# Patient Record
Sex: Male | Born: 1950 | Race: White | Hispanic: No | Marital: Married | Smoking: Never smoker
Health system: Southern US, Community
[De-identification: ages and names within clinical notes are randomized; demographics above are authoritative.]

## PROBLEM LIST (undated history)

## (undated) DIAGNOSIS — B1920 Unspecified viral hepatitis C without hepatic coma: Secondary | ICD-10-CM

## (undated) DIAGNOSIS — M1990 Unspecified osteoarthritis, unspecified site: Secondary | ICD-10-CM

## (undated) DIAGNOSIS — D1720 Benign lipomatous neoplasm of skin and subcutaneous tissue of unspecified limb: Secondary | ICD-10-CM

## (undated) DIAGNOSIS — E785 Hyperlipidemia, unspecified: Secondary | ICD-10-CM

## (undated) HISTORY — PX: CYSTECTOMY: SUR359

## (undated) HISTORY — PX: EYE SURGERY: SHX253

## (undated) HISTORY — DX: Unspecified osteoarthritis, unspecified site: M19.90

## (undated) HISTORY — PX: JOINT REPLACEMENT: SHX530

## (undated) HISTORY — DX: Hyperlipidemia, unspecified: E78.5

## (undated) HISTORY — PX: FOOT ARTHRODESIS, SUBTALAR: SUR53

## (undated) HISTORY — DX: Unspecified viral hepatitis C without hepatic coma: B19.20

## (undated) HISTORY — PX: TONSILLECTOMY: SUR1361

## (undated) HISTORY — DX: Benign lipomatous neoplasm of skin and subcutaneous tissue of unspecified limb: D17.20

---

## 1996-08-16 HISTORY — PX: APPENDECTOMY: SHX54

## 1996-08-16 HISTORY — PX: OTHER SURGICAL HISTORY: SHX169

## 1996-08-16 HISTORY — PX: NASAL SEPTUM SURGERY: SHX37

## 1998-07-17 ENCOUNTER — Emergency Department (HOSPITAL_COMMUNITY): Admission: EM | Admit: 1998-07-17 | Discharge: 1998-07-17 | Payer: Self-pay | Admitting: Emergency Medicine

## 1999-01-23 ENCOUNTER — Ambulatory Visit (HOSPITAL_COMMUNITY): Admission: RE | Admit: 1999-01-23 | Discharge: 1999-01-23 | Payer: Self-pay | Admitting: Family Medicine

## 1999-01-23 ENCOUNTER — Encounter: Payer: Self-pay | Admitting: Family Medicine

## 1999-02-06 ENCOUNTER — Encounter: Payer: Self-pay | Admitting: Family Medicine

## 1999-02-06 ENCOUNTER — Ambulatory Visit (HOSPITAL_COMMUNITY): Admission: RE | Admit: 1999-02-06 | Discharge: 1999-02-06 | Payer: Self-pay | Admitting: Family Medicine

## 2003-12-17 ENCOUNTER — Ambulatory Visit (HOSPITAL_COMMUNITY): Admission: RE | Admit: 2003-12-17 | Discharge: 2003-12-17 | Payer: Self-pay | Admitting: Family Medicine

## 2004-03-09 ENCOUNTER — Ambulatory Visit (HOSPITAL_COMMUNITY): Admission: RE | Admit: 2004-03-09 | Discharge: 2004-03-09 | Payer: Self-pay | Admitting: Gastroenterology

## 2005-01-22 ENCOUNTER — Ambulatory Visit (HOSPITAL_COMMUNITY): Admission: RE | Admit: 2005-01-22 | Discharge: 2005-01-22 | Payer: Self-pay | Admitting: Orthopedic Surgery

## 2010-08-16 HISTORY — PX: OTHER SURGICAL HISTORY: SHX169

## 2010-11-25 ENCOUNTER — Ambulatory Visit (HOSPITAL_BASED_OUTPATIENT_CLINIC_OR_DEPARTMENT_OTHER)
Admission: RE | Admit: 2010-11-25 | Payer: Worker's Compensation | Source: Ambulatory Visit | Admitting: Orthopedic Surgery

## 2011-10-20 ENCOUNTER — Ambulatory Visit (INDEPENDENT_AMBULATORY_CARE_PROVIDER_SITE_OTHER): Payer: Self-pay | Admitting: Surgery

## 2011-11-01 ENCOUNTER — Ambulatory Visit (INDEPENDENT_AMBULATORY_CARE_PROVIDER_SITE_OTHER): Payer: Self-pay | Admitting: Surgery

## 2011-11-08 ENCOUNTER — Encounter (INDEPENDENT_AMBULATORY_CARE_PROVIDER_SITE_OTHER): Payer: Self-pay | Admitting: Surgery

## 2011-11-10 ENCOUNTER — Ambulatory Visit (INDEPENDENT_AMBULATORY_CARE_PROVIDER_SITE_OTHER): Payer: BC Managed Care – PPO | Admitting: Surgery

## 2011-11-10 ENCOUNTER — Encounter (INDEPENDENT_AMBULATORY_CARE_PROVIDER_SITE_OTHER): Payer: Self-pay | Admitting: Surgery

## 2011-11-10 VITALS — BP 146/90 | HR 72 | Temp 97.4°F | Resp 18 | Ht 72.0 in | Wt 190.6 lb

## 2011-11-10 DIAGNOSIS — D492 Neoplasm of unspecified behavior of bone, soft tissue, and skin: Secondary | ICD-10-CM | POA: Insufficient documentation

## 2011-11-10 NOTE — Progress Notes (Signed)
Chief Complaint  Patient presents with  . Mass    left axillary soft tissue mass - referral from Dr. Cheri Rous, [facility]    HISTORY: Patient is a 61 year old white male previously evaluated in our practice in 2010 for soft tissue mass in the left axilla. This has gradually increased in size. He returns today for evaluation and consideration for surgical resection.  Past Medical History  Diagnosis Date  . Asthma   . Hepatitis C   . Hyperlipemia   . DJD (degenerative joint disease)     right ankle  . Lipoma of arm     left     Current Outpatient Prescriptions  Medication Sig Dispense Refill  . pravastatin (PRAVACHOL) 10 MG tablet Take 10 mg by mouth as needed.         No Known Allergies   Family History  Problem Relation Age of Onset  . Coronary artery disease Father   . Heart disease Father   . Stroke Mother   . Lung cancer Paternal Grandmother   . Arthritis Maternal Grandfather   . Cancer Maternal Grandfather     lung  . Cancer Sister     breast     History   Social History  . Marital Status: Married    Spouse Name: N/A    Number of Children: N/A  . Years of Education: N/A   Social History Main Topics  . Smoking status: Never Smoker   . Smokeless tobacco: Never Used  . Alcohol Use: Yes     3 beers day or 1/2 glass of wine  . Drug Use: No  . Sexually Active: None   Other Topics Concern  . None   Social History Narrative  . None     REVIEW OF SYSTEMS - PERTINENT POSITIVES ONLY: No significant pain. No limitation in range of motion. No left upper extremity edema.  EXAM: Filed Vitals:   11/10/11 1127  BP: 146/90  Pulse: 72  Temp: 97.4 F (36.3 C)  Resp: 18    HEENT: normocephalic; pupils equal and reactive; sclerae clear; dentition good; mucous membranes moist NECK:  symmetric on extension; no palpable anterior or posterior cervical lymphadenopathy; no supraclavicular masses; no tenderness CHEST: clear to  auscultation bilaterally without rales, rhonchi, or wheezes CARDIAC: regular rate and rhythm without significant murmur; peripheral pulses are full EXT:  non-tender without edema; no deformity; soft tissue mass left posterior axilla, 6 cm, mobile, nontender NEURO: no gross focal deficits; no sign of tremor   LABORATORY RESULTS: See [facility] (CHL-Epic) for most recent results   RADIOLOGY RESULTS: See [facility] (CHL-Epic) for most recent results   IMPRESSION: Soft tissue mass left axilla, probable lipoma, gradual enlargement in size, 6 cm  PLAN: The patient and I discussed the above findings at length. He had been previously evaluated in our practice for excision. Due to ongoing orthopedic surgical concerns, the patient had not returned for surgical excision. The patient's wife now notes that it has increased in size. It is not causing him any particular symptoms. He now returns to consider surgical excision for definitive diagnosis and management.  The risks and benefits of the procedure have been discussed at length with the patient.  The patient understands the proposed procedure, potential alternative treatments, and the course of recovery to be expected.  All of the patient's questions have been answered at this time.  The patient wishes to proceed with surgery.  Velora Heckler, MD, FACS General &  Endocrine Surgery [facility]   Visit Diagnoses: 1. Neoplasm of soft tissue, left axilla     Primary Care Physician: Dr. Cheri Rous

## 2011-12-01 ENCOUNTER — Telehealth (INDEPENDENT_AMBULATORY_CARE_PROVIDER_SITE_OTHER): Payer: Self-pay

## 2011-12-01 NOTE — Telephone Encounter (Signed)
General Surgery [facility] - Attending  With excision of soft tissue mass, specimen is routinely sent to pathology.  Rare instance of soft tissue malignancy cannot be ruled out otherwise.  Please inform patient that specimen will be submitted to pathology of routine review, as is our normal routine.  Velora Heckler, MD, [facility] Office: [phone]

## 2011-12-01 NOTE — Telephone Encounter (Addendum)
Patient wanted to know if his specimen will be sent out for pathology on day surgery.  He may not want the specimen tested.

## 2011-12-01 NOTE — Telephone Encounter (Signed)
Patient aware.

## 2011-12-27 ENCOUNTER — Encounter (INDEPENDENT_AMBULATORY_CARE_PROVIDER_SITE_OTHER): Payer: Self-pay

## 2011-12-31 ENCOUNTER — Other Ambulatory Visit (INDEPENDENT_AMBULATORY_CARE_PROVIDER_SITE_OTHER): Payer: Self-pay | Admitting: Surgery

## 2011-12-31 DIAGNOSIS — D1739 Benign lipomatous neoplasm of skin and subcutaneous tissue of other sites: Secondary | ICD-10-CM

## 2011-12-31 HISTORY — PX: OTHER SURGICAL HISTORY: SHX169

## 2012-01-03 ENCOUNTER — Telehealth (INDEPENDENT_AMBULATORY_CARE_PROVIDER_SITE_OTHER): Payer: Self-pay

## 2012-01-03 NOTE — Telephone Encounter (Signed)
The patient called and asked what he could use to remove the adhesive after he took off his bandage.  I told him rubbing alcohol or nail polish remover may help.  I said if he is in the neighborhood he could drop by and get some adhesive removal pads.  He lives in [place] so I agreed to mail him some.

## 2012-01-04 ENCOUNTER — Telehealth (INDEPENDENT_AMBULATORY_CARE_PROVIDER_SITE_OTHER): Payer: Self-pay

## 2012-01-04 NOTE — Progress Notes (Signed)
Quick Note:  Please contact patient and notify of benign pathology results.  Danica Camarena M. Volney Reierson, MD, FACS [facility] Office: [phone]   ______ 

## 2012-01-04 NOTE — Telephone Encounter (Signed)
Pt advised of path result and need to keep appt 5-29.

## 2012-01-07 ENCOUNTER — Telehealth (INDEPENDENT_AMBULATORY_CARE_PROVIDER_SITE_OTHER): Payer: Self-pay

## 2012-01-07 NOTE — Telephone Encounter (Signed)
The pt called and asked if he can get in a hot tub.  He is a week out.  I advised that he should not and he should wait at least 4 weeks due to the risk of opening the incision.

## 2012-01-12 ENCOUNTER — Ambulatory Visit (INDEPENDENT_AMBULATORY_CARE_PROVIDER_SITE_OTHER): Payer: BC Managed Care – PPO | Admitting: Surgery

## 2012-01-12 ENCOUNTER — Encounter (INDEPENDENT_AMBULATORY_CARE_PROVIDER_SITE_OTHER): Payer: Self-pay | Admitting: Surgery

## 2012-01-12 VITALS — BP 122/78 | HR 94 | Temp 98.3°F | Ht 72.0 in | Wt 183.0 lb

## 2012-01-12 DIAGNOSIS — D492 Neoplasm of unspecified behavior of bone, soft tissue, and skin: Secondary | ICD-10-CM

## 2012-01-12 NOTE — Progress Notes (Signed)
Visit Diagnoses: 1. Neoplasm of soft tissue, left axilla     HISTORY: Patient is a 61 year old white male who underwent excision of soft tissue mass from the left axilla. This measured 6.8 cm in greatest dimension and was consistent with a benign lipoma.  EXAM: Surgical wound is healing nicely. Mild soft tissue swelling. Possible small seroma. No sign of infection. No tenderness.  IMPRESSION: Status post excision of lipoma from left axilla  PLAN: Patient will begin applying topical creams at his incision. His activity is unrestricted. He will return for a final wound check in 6 weeks.  Velora Heckler, MD, FACS General & Endocrine Surgery [facility]

## 2012-01-12 NOTE — Patient Instructions (Signed)
  COCOA BUTTER & VITAMIN E CREAM  ([employer]mer[employer] or other brand)  Apply cocoa butter/vitamin E cream to your incision 2 - 3 times daily.  Massage cream into incision for one minute with each application.  Use sunscreen (50 SPF or higher) for first 6 months after surgery if area is exposed to sun.  You may substitute Mederma or other scar reducing creams as desired.   

## 2012-02-23 ENCOUNTER — Encounter (INDEPENDENT_AMBULATORY_CARE_PROVIDER_SITE_OTHER): Payer: Self-pay | Admitting: Surgery

## 2012-02-23 ENCOUNTER — Ambulatory Visit (INDEPENDENT_AMBULATORY_CARE_PROVIDER_SITE_OTHER): Payer: BC Managed Care – PPO | Admitting: Surgery

## 2012-02-23 VITALS — BP 120/84 | HR 76 | Temp 97.4°F | Resp 16 | Ht 72.0 in | Wt 183.0 lb

## 2012-02-23 DIAGNOSIS — D492 Neoplasm of unspecified behavior of bone, soft tissue, and skin: Secondary | ICD-10-CM

## 2012-02-23 NOTE — Patient Instructions (Signed)
Monthly self exam until resolved.

## 2012-02-23 NOTE — Progress Notes (Signed)
General Surgery [facility]  Visit Diagnoses: 1. Neoplasm of soft tissue, left axilla     HISTORY: The patient returns for wound check having undergone excision of a lipoma from left axilla.  EXAM: Wound is well healed. No drainage. No sign of seroma. No sign of infection. There is slight induration posterior to the incision. This may represent scar tissue formation due to the proximity of the tumor to the undersurface of the skin.  IMPRESSION: Status post excision benign lipoma from left axilla  PLAN: Patient will perform monthly self-examination. I believe the area of scar tissue formation in the left axilla we'll gradually improve over the coming 6 months. Patient will notify me if there is any significant change. Otherwise the patient will return to see me as needed.  Velora Heckler, MD, FACS General & Endocrine Surgery [facility]

## 2012-06-05 ENCOUNTER — Other Ambulatory Visit: Payer: Self-pay | Admitting: Orthopedic Surgery

## 2012-06-28 ENCOUNTER — Encounter (HOSPITAL_BASED_OUTPATIENT_CLINIC_OR_DEPARTMENT_OTHER): Payer: Self-pay | Admitting: *Deleted

## 2012-06-30 ENCOUNTER — Encounter (HOSPITAL_BASED_OUTPATIENT_CLINIC_OR_DEPARTMENT_OTHER): Payer: Self-pay | Admitting: Anesthesiology

## 2012-06-30 ENCOUNTER — Encounter (HOSPITAL_BASED_OUTPATIENT_CLINIC_OR_DEPARTMENT_OTHER): Payer: Self-pay | Admitting: Orthopedic Surgery

## 2012-06-30 ENCOUNTER — Ambulatory Visit (HOSPITAL_BASED_OUTPATIENT_CLINIC_OR_DEPARTMENT_OTHER)
Admission: RE | Admit: 2012-06-30 | Discharge: 2012-06-30 | Disposition: A | Discharge status: Home / Self Care | Payer: Worker's Compensation | Source: Ambulatory Visit | Attending: Orthopedic Surgery | Admitting: Orthopedic Surgery

## 2012-06-30 ENCOUNTER — Ambulatory Visit (HOSPITAL_BASED_OUTPATIENT_CLINIC_OR_DEPARTMENT_OTHER): Payer: Worker's Compensation | Admitting: Anesthesiology

## 2012-06-30 ENCOUNTER — Encounter (HOSPITAL_BASED_OUTPATIENT_CLINIC_OR_DEPARTMENT_OTHER)
Admission: RE | Disposition: A | Discharge status: Home / Self Care | Payer: Self-pay | Source: Ambulatory Visit | Attending: Orthopedic Surgery

## 2012-06-30 DIAGNOSIS — J45909 Unspecified asthma, uncomplicated: Secondary | ICD-10-CM | POA: Insufficient documentation

## 2012-06-30 DIAGNOSIS — E785 Hyperlipidemia, unspecified: Secondary | ICD-10-CM | POA: Insufficient documentation

## 2012-06-30 DIAGNOSIS — B1920 Unspecified viral hepatitis C without hepatic coma: Secondary | ICD-10-CM | POA: Insufficient documentation

## 2012-06-30 DIAGNOSIS — M19079 Primary osteoarthritis, unspecified ankle and foot: Secondary | ICD-10-CM | POA: Insufficient documentation

## 2012-06-30 DIAGNOSIS — M19049 Primary osteoarthritis, unspecified hand: Secondary | ICD-10-CM | POA: Insufficient documentation

## 2012-06-30 HISTORY — PX: FINGER ARTHROPLASTY: SHX5017

## 2012-06-30 SURGERY — ARTHROPLASTY, FINGER
Anesthesia: General | Site: Finger | Laterality: Right | Wound class: Clean

## 2012-06-30 MED ORDER — OXYCODONE HCL 5 MG PO TABS: 5.0000 mg | ORAL_TABLET | Freq: Once | ORAL | Status: DC | PRN

## 2012-06-30 MED ORDER — CEPHALEXIN 250 MG PO CAPS: 250.0000 mg | ORAL_CAPSULE | Freq: Four times a day (QID) | ORAL | Status: DC

## 2012-06-30 MED ORDER — FENTANYL CITRATE 0.05 MG/ML IJ SOLN
50.0000 ug | Freq: Once | INTRAMUSCULAR | Status: AC
  Administered 2012-06-30: 50 ug via INTRAVENOUS
  Administered 2012-06-30: 100 ug via INTRAVENOUS

## 2012-06-30 MED ORDER — PROMETHAZINE HCL 25 MG/ML IJ SOLN: 6.2500 mg | INTRAMUSCULAR | Status: DC | PRN

## 2012-06-30 MED ORDER — OXYCODONE-ACETAMINOPHEN 7.5-325 MG PO TABS: 1.0000 | ORAL_TABLET | ORAL | Status: DC | PRN

## 2012-06-30 MED ORDER — DEXAMETHASONE SODIUM PHOSPHATE 4 MG/ML IJ SOLN
INTRAMUSCULAR | Status: DC | PRN
  Administered 2012-06-30: 10 mg via INTRAVENOUS

## 2012-06-30 MED ORDER — MIDAZOLAM HCL 2 MG/2ML IJ SOLN
1.0000 mg | INTRAMUSCULAR | Status: DC | PRN
  Administered 2012-06-30 (×2): 2 mg via INTRAVENOUS

## 2012-06-30 MED ORDER — LACTATED RINGERS IV SOLN
INTRAVENOUS | Status: DC
  Administered 2012-06-30 (×2): via INTRAVENOUS

## 2012-06-30 MED ORDER — ONDANSETRON HCL 4 MG/2ML IJ SOLN
INTRAMUSCULAR | Status: DC | PRN
  Administered 2012-06-30: 4 mg via INTRAVENOUS

## 2012-06-30 MED ORDER — PROPOFOL 10 MG/ML IV BOLUS
INTRAVENOUS | Status: DC | PRN
  Administered 2012-06-30: 250 mg via INTRAVENOUS

## 2012-06-30 MED ORDER — CHLORHEXIDINE GLUCONATE 4 % EX LIQD: 60.0000 mL | Freq: Once | CUTANEOUS | Status: DC

## 2012-06-30 MED ORDER — HYDROMORPHONE HCL PF 1 MG/ML IJ SOLN: 0.2500 mg | INTRAMUSCULAR | Status: DC | PRN

## 2012-06-30 MED ORDER — DEXAMETHASONE SODIUM PHOSPHATE 4 MG/ML IJ SOLN: INTRAMUSCULAR | Status: DC | PRN

## 2012-06-30 MED ORDER — BUPIVACAINE-EPINEPHRINE PF 0.5-1:200000 % IJ SOLN
INTRAMUSCULAR | Status: DC | PRN
  Administered 2012-06-30: 222 mL

## 2012-06-30 MED ORDER — CEFAZOLIN SODIUM-DEXTROSE 2-3 GM-% IV SOLR
2.0000 g | INTRAVENOUS | Status: AC
  Administered 2012-06-30: 2 g via INTRAVENOUS

## 2012-06-30 MED ORDER — OXYCODONE HCL 5 MG/5ML PO SOLN: 5.0000 mg | Freq: Once | ORAL | Status: DC | PRN

## 2012-06-30 SURGICAL SUPPLY — 46 items
BANDAGE GAUZE ELAST BULKY 4 IN (GAUZE/BANDAGES/DRESSINGS) ×2 IMPLANT
BLADE MINI RND TIP GREEN BEAV (BLADE) ×2 IMPLANT
BLADE OSC/SAG .038X5.5 CUT EDG (BLADE) ×2 IMPLANT
BLADE SURG 15 STRL LF DISP TIS (BLADE) ×2 IMPLANT
BLADE SURG 15 STRL SS (BLADE) ×2
BNDG COHESIVE 3X5 TAN STRL LF (GAUZE/BANDAGES/DRESSINGS) ×2 IMPLANT
BNDG ESMARK 4X9 LF (GAUZE/BANDAGES/DRESSINGS) ×2 IMPLANT
BUR FAST CUTTING MED (BURR) IMPLANT
CHLORAPREP W/TINT 26ML (MISCELLANEOUS) ×2 IMPLANT
CLOTH BEACON ORANGE TIMEOUT ST (SAFETY) ×2 IMPLANT
CORDS BIPOLAR (ELECTRODE) ×2 IMPLANT
COVER MAYO STAND STRL (DRAPES) ×2 IMPLANT
COVER TABLE BACK 60X90 (DRAPES) ×2 IMPLANT
CUFF TOURNIQUET SINGLE 18IN (TOURNIQUET CUFF) ×2 IMPLANT
DECANTER SPIKE VIAL GLASS SM (MISCELLANEOUS) IMPLANT
DRAPE EXTREMITY T 121X128X90 (DRAPE) ×2 IMPLANT
DRAPE OEC MINIVIEW 54X84 (DRAPES) ×2 IMPLANT
DRAPE SURG 17X23 STRL (DRAPES) ×2 IMPLANT
GAUZE XEROFORM 1X8 LF (GAUZE/BANDAGES/DRESSINGS) ×2 IMPLANT
GLOVE BIO SURGEON STRL SZ 6.5 (GLOVE) ×4 IMPLANT
GLOVE INDICATOR 7.0 STRL GRN (GLOVE) ×4 IMPLANT
GLOVE SURG ORTHO 8.0 STRL STRW (GLOVE) ×2 IMPLANT
GOWN BRE IMP PREV XXLGXLNG (GOWN DISPOSABLE) ×2 IMPLANT
GOWN PREVENTION PLUS XLARGE (GOWN DISPOSABLE) ×4 IMPLANT
IMPLANT FINGER JOINT SZ8 (Finger Joint) ×4 IMPLANT
LOOP VESSEL MAXI BLUE (MISCELLANEOUS) ×2 IMPLANT
NS IRRIG 1000ML POUR BTL (IV SOLUTION) ×2 IMPLANT
PACK BASIN DAY SURGERY FS (CUSTOM PROCEDURE TRAY) ×2 IMPLANT
PAD CAST 3X4 CTTN HI CHSV (CAST SUPPLIES) ×1 IMPLANT
PADDING CAST ABS 3INX4YD NS (CAST SUPPLIES)
PADDING CAST ABS 4INX4YD NS (CAST SUPPLIES) ×1
PADDING CAST ABS COTTON 3X4 (CAST SUPPLIES) IMPLANT
PADDING CAST ABS COTTON 4X4 ST (CAST SUPPLIES) ×1 IMPLANT
PADDING CAST COTTON 3X4 STRL (CAST SUPPLIES) ×1
SLEEVE SCD COMPRESS KNEE MED (MISCELLANEOUS) ×2 IMPLANT
SPLINT PLASTER CAST XFAST 3X15 (CAST SUPPLIES) IMPLANT
SPLINT PLASTER XTRA FASTSET 3X (CAST SUPPLIES)
SPONGE GAUZE 4X4 12PLY (GAUZE/BANDAGES/DRESSINGS) ×2 IMPLANT
STOCKINETTE 4X48 STRL (DRAPES) ×2 IMPLANT
SUT VICRYL 4-0 PS2 18IN ABS (SUTURE) ×2 IMPLANT
SUT VICRYL RAPIDE 4/0 PS 2 (SUTURE) ×2 IMPLANT
SYR BULB 3OZ (MISCELLANEOUS) ×2 IMPLANT
SYR CONTROL 10ML LL (SYRINGE) IMPLANT
TOWEL OR 17X24 6PK STRL BLUE (TOWEL DISPOSABLE) ×2 IMPLANT
UNDERPAD 30X30 INCONTINENT (UNDERPADS AND DIAPERS) ×2 IMPLANT
WATER STERILE IRR 1000ML POUR (IV SOLUTION) ×2 IMPLANT

## 2012-06-30 NOTE — Anesthesia Procedure Notes (Signed)
Anesthesia Regional Block:  Supraclavicular block  Pre-Anesthetic Checklist: ,, timeout performed, Correct Patient, Correct Site, Correct Laterality, Correct Procedure, Correct Position, site marked, Risks and benefits discussed,  Surgical consent,  Pre-op evaluation,  At surgeon's request and post-op pain management  Laterality: Right  Prep: chloraprep       Needles:  Injection technique: Single-shot  Needle Type: Echogenic Stimulator Needle     Needle Length: 5cm 5 cm Needle Gauge: 22 and 22 G    Additional Needles:  Procedures: ultrasound guided (picture in chart) and nerve stimulator Supraclavicular block  Nerve Stimulator or Paresthesia:  Response: 0.48 mA,   Additional Responses:   Narrative:  Start time: 06/30/2012 1:06 PM End time: 06/30/2012 1:20 PM Injection made incrementally with aspirations every 3 mL. Anesthesiologist: Dr Gypsy Balsam  Additional Notes: 1610-9604 R Supraclav N Block POP CHG prep, sterile tech #22 stim needle w/stim down to .48ma, good [place] visualization with pix in chart Multiple neg asp Marc .5% w/epi 22cc+ decadron 4mg  infiltrated No compl Dr Gypsy Balsam

## 2012-06-30 NOTE — Anesthesia Preprocedure Evaluation (Signed)
Anesthesia Evaluation  Patient identified by MRN, date of birth, ID band Patient awake    Reviewed: Allergy & Precautions, H&P , NPO status , Patient's Chart, lab work & pertinent test results  Airway Mallampati: I  Neck ROM: Full    Dental   Pulmonary asthma ,  breath sounds clear to auscultation        Cardiovascular Rhythm:Regular Rate:Normal     Neuro/Psych    GI/Hepatic (+) Hepatitis -, C  Endo/Other    Renal/GU      Musculoskeletal   Abdominal   Peds  Hematology   Anesthesia Other Findings   Reproductive/Obstetrics                           Anesthesia Physical Anesthesia Plan  ASA: III  Anesthesia Plan: General   Post-op Pain Management:    Induction: Intravenous  Airway Management Planned: LMA  Additional Equipment:   Intra-op Plan:   Post-operative Plan: Extubation in OR  Informed Consent: I have reviewed the patients History and Physical, chart, labs and discussed the procedure including the risks, benefits and alternatives for the proposed anesthesia with the patient or authorized representative who has indicated his/her understanding and acceptance.     Plan Discussed with: CRNA and Surgeon  Anesthesia Plan Comments:         Anesthesia Quick Evaluation

## 2012-06-30 NOTE — Progress Notes (Signed)
Assisted Dr. Kasik with right, ultrasound guided, supraclavicular block. Side rails up, monitors on throughout procedure. See vital signs in flow sheet. Tolerated Procedure well. 

## 2012-06-30 NOTE — Brief Op Note (Signed)
06/30/2012  3:34 PM  PATIENT:  [name]  61 y.o. 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  POST-OPERATIVE DIAGNOSIS:  degenerative joint disease right middle/index finger  PROCEDURE:  Procedure(s) (LRB) with comments: FINGER ARTHROPLASTY (Right) - Right Silastic Arthroplasty Metacarpophalangeal Joint Right Index Right Middle  SURGEON:  Surgeon(s) and Role:    * Nicki Reaper, MD - Primary  PHYSICIAN ASSISTANT:   ASSISTANTS: none   ANESTHESIA:   regional and general  EBL:  Total I/O In: 1000 [I.V.:1000] Out: -   BLOOD ADMINISTERED:none  DRAINS: none   LOCAL MEDICATIONS USED:  NONE  SPECIMEN:  Excision  DISPOSITION OF SPECIMEN:  PATHOLOGY  COUNTS:  YES  TOURNIQUET:   Total Tourniquet Time Documented: Upper Arm (Right) - 98 minutes  DICTATION: .Other Dictation: Dictation Number [id number]  PLAN OF CARE: Discharge to home after PACU  PATIENT DISPOSITION:  PACU - hemodynamically stable.

## 2012-06-30 NOTE — Transfer of Care (Signed)
Immediate Anesthesia Transfer of Care Note  Patient: [name]  Procedure(s) Performed: Procedure(s) (LRB) with comments: FINGER ARTHROPLASTY (Right) - Right Silastic Arthroplasty Metacarpophalangeal Joint Right Index Right Middle  Patient Location: PACU  Anesthesia Type:GA combined with regional for post-op pain  Level of Consciousness: sedated  Airway & Oxygen Therapy: Patient Spontanous Breathing and Patient connected to face mask oxygen  Post-op Assessment: Report given to PACU RN and Post -op Vital signs reviewed and stable  Post vital signs: Reviewed and stable  Complications: No apparent anesthesia complications

## 2012-06-30 NOTE — Op Note (Signed)
Dictation Number [id number]

## 2012-06-30 NOTE — Anesthesia Postprocedure Evaluation (Signed)
  Anesthesia Post-op Note  Patient: [name]  Procedure(s) Performed: Procedure(s) (LRB) with comments: FINGER ARTHROPLASTY (Right) - Right Silastic Arthroplasty Metacarpophalangeal Joint Right Index Right Middle  Patient Location: PACU  Anesthesia Type:GA combined with regional for post-op pain  Level of Consciousness: awake and alert   Airway and Oxygen Therapy: Patient Spontanous Breathing  Post-op Pain: none  Post-op Assessment: Post-op Vital signs reviewed, Patient's Cardiovascular Status Stable, Respiratory Function Stable, Patent Airway, No signs of Nausea or vomiting and Pain level controlled  Post-op Vital Signs: stable  Complications: No apparent anesthesia complications

## 2012-06-30 NOTE — H&P (Signed)
[name] is a 61 year old right hand dominant male referred by Dr. Marny Lowenstein for a consultation with respect to pain in his right index finger MCP joint. He states that it is swollen and painful. This has been going on for several months. He recalls no specific history of injury to it. He states he is using a scanning gun at work which rests against that area hitting it multiple times a day. This has increased his discomfort. He complains of constant severe, to extremely severe sharp pain with a feeling of swelling. Activity makes it worse, rest makes it better. He has been taking Tylenol in that he cannot tolerate long periods of nonsteroidal anti-inflammatories. He has taken Celebrex in the past which has resulted in increased liver enzymes. He has a history of Hepatitis C. He has no history of diabetes or thyroid problems. He does have a history of arthritis in his ankle. No history of gout. There is a family history of rheumatoid arthritis in his grandmother. He has not taken any other treatment other than Glucosamine and Chondroitin sulfate primarily for his ankle.  He is not complaining of his opposite hand.[name] has a long 2 year history of conservative care of his right hand, index middle finger metacarpophalangeal joint arthritis. He continues to complain of ongoing pain. He would like to proceed to have these replaced.     Past Medical History: He has no allergies. He is occasionally taking Celebrex, ibuprofen, Glucosamine, Tumeric, baby aspirin, Tylenol. He has had a tonsillectomy, deviated septum repair, right ankle surgery, appendectomy and varicocele.  Family Medical History: Positive for heart disease, high BP and arthritis.  Social History: He does not smoke. He drinks socially. 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  Review of Systems: Positive for glasses, contacts, asthma, Hepatitis C, otherwise negative.   Chief Complaint: Pain MCPS index and middle right HPI: see above  Past Medical  History  Diagnosis Date  . Asthma   . Hepatitis C   . Hyperlipemia   . Lipoma of arm     left  . DJD (degenerative joint disease)     right ankle, rt hand    Past Surgical History  Procedure Date  . Tonsillectomy     in childhood  . Vericoscele 1998  . Nasal septum surgery 1998  . Orif fx 2012    right ankle  . Appendectomy 1998  . Axilla mass excision 12/31/11    Left  . Cystectomy     lt armpit  . Foot arthrodesis, subtalar     rt    Family History  Problem Relation Age of Onset  . Coronary artery disease Father   . Heart disease Father   . Stroke Mother   . Lung cancer Paternal Grandmother   . Arthritis Maternal Grandfather   . Cancer Maternal Grandfather     lung  . Cancer Sister     breast   Social History:  reports that he has never smoked. He has never used smokeless tobacco. He reports that he drinks alcohol. He reports that he does not use illicit drugs.  Allergies: No Known Allergies  No prescriptions prior to admission    No results found for this or any previous visit (from the past 48 hour(s)).  No results found.   Pertinent items are noted in HPI.  Height 6' (1.829 m), weight 83.915 kg (185 lb).  General appearance: alert, cooperative and appears stated age Head: Normocephalic, without obvious abnormality Neck: no  adenopathy Resp: clear to auscultation bilaterally Cardio: regular rate and rhythm, S1, S2 normal, no murmur, click, rub or gallop GI: soft, non-tender; bowel sounds normal; no masses,  no organomegaly Extremities: extremities normal, atraumatic, no cyanosis or edema Pulses: 2+ and symmetric Skin: Skin color, texture, turgor normal. No rashes or lesions Neurologic: Grossly normal Incision/Wound: na  Assessment/Plan We will schedule him for Silastic interposition arthroplasties index and middle fingers right hand, as an outpatient.  He is aware of risks and complications including infection, breakage, range of motion which we  view less than normal.  He would like to proceed.  Pawan Knechtel R 06/30/2012, 10:08 AM

## 2012-07-01 NOTE — Op Note (Signed)
[name], [name]               ACCOUNT NO.:  [id number]  MEDICAL RECORD NO.:  [id number]  LOCATION:                                 FACILITY:  PHYSICIAN:  Cindee Salt, M.D.            DATE OF BIRTH:  DATE OF PROCEDURE:  06/30/2012 DATE OF DISCHARGE:                              OPERATIVE REPORT   PREOPERATIVE DIAGNOSIS:  Degenerative arthritis, metacarpophalangeal joint, right index, right middle finger.  POSTOPERATIVE DIAGNOSIS:  Degenerative arthritis, metacarpophalangeal joint, right index, right middle finger.  OPERATION:  Silastic interposition arthroplasty, metacarpophalangeal joint, right index, right middle finger with reconstruction, collateral ligaments, radial side.  SURGEON:  Cindee Salt, MD  ASSISTANT:  None.  ANESTHESIA:  Supraclavicular block general.  ANESTHESIOLOGIST:  Bedelia Person, M.D.  HISTORY:  The patient is a 61 year old male with a long history of pain in the metacarpophalangeal joints, right index, right middle fingers.  X- rays reveal a volar translation with marked degenerative changes of metacarpophalangeal joints, index middle finger.  He is failed conservative treatment and has continued to have pain, discomfort, has elected to undergo surgical repair with interposition arthroplasty using silastic right Medical [employer]anson arthroplasties.  He is aware of risks and complications including infection, recurrence of injury to arteries, nerves, tendons, incomplete relief of symptoms, dystrophy, possibility of breakage of the prostheses, and possible removal.  He has agreed to proceed for relief of pain.  He is seen in the preoperative area, the extremity marked by both the patient and surgeon.  Antibiotic given.  PROCEDURE:  The patient was brought to the operating room where a supraclavicular block was carried out without difficulty.  A general anesthetic given under the direction of Dr. Gypsy Balsam.  He was prepped using ChloraPrep, supine position with  the right arm free.  A 3-minute dry time was allowed.  Time-out taken, confirming the patient and procedure. The limb was exsanguinated with an Esmarch bandage.  Tourniquet placed high and the arm was inflated to 250 mmHg.  A longitudinal incision was made over the metacarpophalangeal joint to the index middle finger and carried down through subcutaneous tissue.  Bleeders were electrocauterized.  Incisions made to the radial side.  The extensor tendons through the sagittal fibers, an incision was then made into the capsule to the ulnar side.  The capsule was elevated.  The collateral ligaments were incised on each of the digits.  The joints were inspected.  A total loss of cartilage was present on the volar aspect of the metacarpal head.  Dorsal aspect of the proximal phalanges on each finger.  An oscillating saw was then used to remove the metacarpal head from each digit slightly distal to the insertion of the collateral ligament, which was left and protected after releasing it from the radial side.  Drill holes were then placed into the proximal phalanx base using a 35 K-Wire.  This allowed a opening to be made in the proximal phalanx base which was markedly thickened and sclerotic.  This was enlarged with a rongeur and then rasped up until a level of approximately a number 8 prostheses.  This was able to be placed without difficulty distally.  The proximal metacarpal was then attended to the central aspect identified.  A rasp was then placed proximally and this was enlarged to the same degree.  This was done on each of the fingers allowing placement of a number 8 trial which lied in good position to each digit.  The digits were then copiously irrigated with saline. Drill holes were placed for reattachment of the radial collateral ligaments.  A 2-0 Mersilene suture was then passed through the drill holes through the ligaments left to be tied over the bone on each of the fingers.  A #8  prosthesis right Medical was then selected, placed into the metacarpal and then distally into the proximal phalanx being certain that the prosthesis seated well proximally and distally on the index finger.  This lied in good position.  A 2nd number 8 prosthesis was going to be placed into the middle finger.  The capsule was then closed of the index finger after repairing the collateral ligament, tying it over bone on the radial side.  The 2nd prosthesis was then placed into the metacarpophalangeal joint of the middle finger again with a no touch technique first placing it approximately with flexion of the metacarpophalangeal joint.  The distal stem was placed down the shaft of the proximal phalanx.  The capsule was then closed after repair of the radial collateral ligament on the middle finger.  X-rays were taken of the AP lateral oblique confirming positioning of the prostheses.  Each cut in the metacarpal head was done with a slightly oblique nature so as to direct the prosthesis in a slight radial deviation.  The prosthesis lied in good position.  The extensor tendons were then repaired in a slight pants-over-vest manner, centralizing these over the prosthesis with a slight radial pull.  This was done with figure-of-eight 4-0 Mersilene sutures.  The skin was then closed after irrigation with interrupted 4-0 Vicryl Rapide sutures.  A sterile compressive dressing, dorsal splint, all fingers was applied with the metacarpophalangeal joint flexed approximately 30 degrees.  On deflation of the tourniquet, all fingers immediately pinked.  He was taken to the recovery room for observation in satisfactory condition.  He will be discharged on Percocet and Keflex, to return in 1 week.  Bleeders were electrocauterized throughout the procedure with bipolar.  Care was taken to protect the dorsal neural structures.  These were identified and retracted gently into the gutters of the metacarpal head.   The metacarpal head was sent to Pathology only for identification.  The patient tolerated the procedure well was taken to the recovery room for observation.          ______________________________ Cindee Salt, M.D.     GK/MEDQ  D:  06/30/2012  T:  07/01/2012  Job:  [id number]

## 2012-07-03 ENCOUNTER — Encounter (HOSPITAL_BASED_OUTPATIENT_CLINIC_OR_DEPARTMENT_OTHER): Payer: Self-pay | Admitting: Orthopedic Surgery

## 2013-10-04 ENCOUNTER — Other Ambulatory Visit: Payer: Self-pay | Admitting: Internal Medicine

## 2013-10-04 DIAGNOSIS — B1920 Unspecified viral hepatitis C without hepatic coma: Secondary | ICD-10-CM

## 2013-10-10 ENCOUNTER — Ambulatory Visit
Admission: RE | Admit: 2013-10-10 | Discharge: 2013-10-10 | Disposition: A | Discharge status: Home / Self Care | Payer: Medicare Other | Source: Ambulatory Visit | Attending: Internal Medicine | Admitting: Internal Medicine

## 2013-10-10 DIAGNOSIS — B1920 Unspecified viral hepatitis C without hepatic coma: Secondary | ICD-10-CM

## 2014-05-27 ENCOUNTER — Ambulatory Visit (INDEPENDENT_AMBULATORY_CARE_PROVIDER_SITE_OTHER): Payer: Medicare Other

## 2014-05-27 VITALS — BP 145/86 | HR 74 | Resp 12

## 2014-05-27 DIAGNOSIS — R52 Pain, unspecified: Secondary | ICD-10-CM

## 2014-05-27 DIAGNOSIS — M722 Plantar fascial fibromatosis: Secondary | ICD-10-CM

## 2014-05-27 NOTE — Patient Instructions (Signed)
ICE INSTRUCTIONS  Apply ice or cold pack to the affected area at least 3 times a day for 10-15 minutes each time.  You should also use ice after prolonged activity or vigorous exercise.  Do not apply ice longer than 20 minutes at one time.  Always keep a cloth between your skin and the ice pack to prevent burns.  Being consistent and following these instructions will help control your symptoms.  We suggest you purchase a gel ice pack because they are reusable and do bit leak.  Some of them are designed to wrap around the area.  Use the method that works best for you.  Here are some other suggestions for icing.   Use a frozen bag of peas or corn-inexpensive and molds well to your body, usually stays frozen for 10 to 20 minutes.  Wet a towel with cold water and squeeze out the excess until it's damp.  Place in a bag in the freezer for 20 minutes. Then remove and use.   Maintain taping for 5 days. Apply Saran wrap and socks and a freezer bag with rubberbanded duct tape to keep the foot dry for showers

## 2014-05-27 NOTE — Progress Notes (Signed)
   Subjective:    Patient ID: [name], male    DOB: 05-20-1951, 63 y.o.   MRN: [id number]  HPI  PT STATED LT FOOT BOTTOM OF THE HEEL IS BEEN SORE FOR 6 MONTHS. THE HEEL IS BEEN THE SAME BUT IT GET WORSE THE END OF THE DAY. THE HEEL GET AGGRAVATED BY PRESSURE/WALKING. TRIED USING VOLTAREN GEL AND IT HELP.   Review of Systems  Allergic/Immunologic: Positive for environmental allergies.  All other systems reviewed and are negative.      Objective:   Physical Exam 63 year old white male well-developed well-nourished oriented x3 presents this time with a history of left heel and arch pain inferior calcaneal tubercle area left foot. Patient is absent history gait changes head trauma to his right foot and ankle with a subtalar fusion and as well as and ankle joint replacement be scheduled sometime in the near future. Next  Partially objective findings as follows neurovascular status is intact pedal pulses palpable DP and PT +2/4 bilateral capillary refill time 3 seconds epicritic and proprioceptive sensations intact and symmetric there is normal plantar response DTRs not listed neurologically skin color pigment normal hair growth is present diminished distally patient taking Collene Mares supplements glucosamine vitamin D and fish oil does have Celebrex daily for breakthrough pain. Remainder of exam otherwise unremarkable x-rays reveal no inferior calcaneal spurring myofascial thickening mild promontory changes on weightbearing noted clinically and radiographically. Clinically there is pain on palpation of the medial band high-fashion medial calcaneal tubercle insertion. No fractures noted       Assessment & Plan:  Assessment plantar fasciitis/heel spur syndrome left heel Plastizote fascial strapping applied maintain for 5 days also recommended ice to Celebrex as needed for pain otherwise plain Tylenol maintain a good study she does have crocs for around the house no barefoot or flimsy shoes or  flip-flops reevaluate 2 weeks may be candidate for functional orthoses in the future based on progress  Harriet Masson DP

## 2014-06-13 ENCOUNTER — Ambulatory Visit (INDEPENDENT_AMBULATORY_CARE_PROVIDER_SITE_OTHER): Payer: Medicare Other

## 2014-06-13 VITALS — BP 124/82 | HR 79 | Resp 12

## 2014-06-13 DIAGNOSIS — M722 Plantar fascial fibromatosis: Secondary | ICD-10-CM

## 2014-06-13 DIAGNOSIS — R52 Pain, unspecified: Secondary | ICD-10-CM

## 2014-06-13 MED ORDER — TRIAMCINOLONE ACETONIDE 10 MG/ML IJ SUSP: 10.0000 mg | Freq: Once | INTRAMUSCULAR | Status: DC

## 2014-06-13 NOTE — Progress Notes (Signed)
   Subjective:    Patient ID: [name], male    DOB: 11-Aug-1951, 63 y.o.   MRN: [id number]  HPI  ''LT FOOT STILL HAVING PAIN.''  Review of Systems no new findings or systemic changes noted    Objective:   Physical Exam Neurovascular status unchanged pedal pulses palpable DP and PT +2 over 4 Refill time 3 seconds there is still pain on palpation of the medial band of the plantar fascia medial calcaneal tubercle left heel and from inferior heel directly there is pain with standing walking activities ambulation. No new findings no other systemic changes patient wearing a pair of crocs flip-flops which are beneficial however also indicates he does feel better sometimes when he wears a new balance athletic shoe however the current shoes wearing has some supportive new balance did not. At this time remainder of exam unremarkable no open wounds no ulcers no secondary infections patient was to avoid he took Celebrex for approximately a week provided some temporary relief to taping may have felt better temporarily was in place however since that time symptoms have recurred.       Assessment & Plan:  Assessment plantar fascitis with heel spur syndrome left foot. The strapping was beneficial and at this time recommend orthoses patient's insurance does not cover orthotics 1 para power step orthotics are dispensed with breaking wearing instructions ABN form is signed is a noncovered service is minimal care provider. Command Tylenol for pain maintaining good stable walking or athletic shoe and an power step insoles at all times suggested crocs around the house barefoot or flimsy shoes or flip-flops [place] in 1-2 months if symptoms fail to improve. Patient also per his request my recommendation injection 10 number of Kenalog and 20 g Marcaine plain infiltrated to the inferior calcaneal tubercle on the left heel patient tolerated the injection well K steroids if helped him for other issues in the past and will  give this as a trial as long as well as the orthotic [place] written instructions for orthotic breaking her given at this time maintain stable shoes at all times     Harriet Masson DPM

## 2014-06-13 NOTE — Patient Instructions (Signed)

## 2014-06-30 ENCOUNTER — Encounter: Payer: Self-pay | Admitting: *Deleted

## 2014-08-02 ENCOUNTER — Ambulatory Visit (INDEPENDENT_AMBULATORY_CARE_PROVIDER_SITE_OTHER): Payer: Medicare Other

## 2014-08-02 VITALS — BP 131/72 | HR 76 | Resp 12

## 2014-08-02 DIAGNOSIS — M722 Plantar fascial fibromatosis: Secondary | ICD-10-CM

## 2014-08-02 DIAGNOSIS — R52 Pain, unspecified: Secondary | ICD-10-CM

## 2014-08-02 NOTE — Patient Instructions (Signed)
ICE INSTRUCTIONS  Apply ice or cold pack to the affected area at least 3 times a day for 10-15 minutes each time.  You should also use ice after prolonged activity or vigorous exercise.  Do not apply ice longer than 20 minutes at one time.  Always keep a cloth between your skin and the ice pack to prevent burns.  Being consistent and following these instructions will help control your symptoms.  We suggest you purchase a gel ice pack because they are reusable and do bit leak.  Some of them are designed to wrap around the area.  Use the method that works best for you.  Here are some other suggestions for icing.   Use a frozen bag of peas or corn-inexpensive and molds well to your body, usually stays frozen for 10 to 20 minutes.  Wet a towel with cold water and squeeze out the excess until it's damp.  Place in a bag in the freezer for 20 minutes. Then remove and use.  Recommend alternating warm compress and ice pack 10 minutes each do hot cold hot and cold at least 2 or 3 times every evening.  Maintain orthotics at all times including at work and at home. At home also consider using crocs or some shoe with support to avoid going barefoot at any time.  Consider using Celebrex or ibuprofen for 3 or 4 days to help the inflammation calm back down. If the heel continues to painful and a need to consider immobilization of the fascia completely in the future

## 2014-08-02 NOTE — Progress Notes (Signed)
   Subjective:    Patient ID: [name], male    DOB: September 10, 1950, 63 y.o.   MRN: [id number]  HPI  ''DR. [place] PUT SHOT LAST TIME AND STILL LITTLE SORE.''  Review of Systems no new findings or systemic changes noted     Objective:   Physical Exam Patient continues to have pain of his left heel has tenderness on direct palpation over the medial calcaneal tubercle as well as significant pain inferior calcaneal tubercle the entire plantar fascial surface is definitely still painful inflamed and somewhat indurated. Discoloration no bruise no sign of puncture wound from the injection site. No fever no increased temperature in the side no signs of infection patient feels that the shot may have made it worse and advising the patient this time that his continued plantar fascial symptom allergy the fascia runs from medial to lateral including the medial body of the calcaneus can become inflamed injection may have certainly irritated superficial tissue however the deep fascia still painful tender and symptomatic. Patient is not wearing his orthosis at this time injection wearing shoes that have flexible mid soles rather than a firm insole. He is also not been taking any NSAIDs nor has he used any ice as instructed. Patient is advised this time that treating plantar fasciitis needs to be done on a comprehensive met basis including using the heat and ice maintaining orthoses at all times utilizing an NSAID periodically to help reduce the inflammation. The bruising from the injection should have cleared at this point as the injection was done more than a month ago any material that was injection at this point has certainly dissipated or resolved with no longer be causing her be a point or focus of the pain he is continuing to have significant plantar fascial symptomology.       Assessment & Plan:  Assessment persistent plantar fasciitis/heel spur syndrome left foot patient not wearing the orthoses  nor using ice nor using the anti-inflammatories consistently this time stressed the importance of consistent treatment patient is working active and hasn't palliative had not time is about this in the injection patient feels his what is making it worse and tried explained the patient the injection certainly may feel tender at the point of injection however any bruising from the injection would've dissipated by this time he continues to have pain because of lack of adjunct of treatment. At this time encouraged to use the heat ice and NSAID and orthoses reevaluate within next month or 2 if he continues to be painful further invasive or noninvasive studies may be considered MRI could be considered to rule out any other difficulty. However immobilization with a fracture boot or even surgical intervention for plantar fasciitis may be necessary at some point  Harriet Masson DPM

## 2015-04-30 ENCOUNTER — Other Ambulatory Visit: Payer: Self-pay | Admitting: Gastroenterology

## 2015-06-23 ENCOUNTER — Encounter (HOSPITAL_COMMUNITY): Payer: Self-pay

## 2015-06-23 ENCOUNTER — Ambulatory Visit (HOSPITAL_COMMUNITY): Admit: 2015-06-23 | Payer: Self-pay | Admitting: Gastroenterology

## 2015-06-23 SURGERY — COLONOSCOPY WITH PROPOFOL
Anesthesia: Monitor Anesthesia Care

## 2015-07-03 ENCOUNTER — Other Ambulatory Visit: Payer: Self-pay | Admitting: Gastroenterology

## 2015-08-19 DIAGNOSIS — J069 Acute upper respiratory infection, unspecified: Secondary | ICD-10-CM | POA: Diagnosis not present

## 2015-08-19 DIAGNOSIS — J209 Acute bronchitis, unspecified: Secondary | ICD-10-CM | POA: Diagnosis not present

## 2015-08-20 ENCOUNTER — Encounter (HOSPITAL_COMMUNITY): Payer: Self-pay | Admitting: *Deleted

## 2015-09-01 ENCOUNTER — Encounter (HOSPITAL_COMMUNITY)
Admission: RE | Disposition: A | Discharge status: Home / Self Care | Payer: Self-pay | Source: Ambulatory Visit | Attending: Gastroenterology

## 2015-09-01 ENCOUNTER — Ambulatory Visit (HOSPITAL_COMMUNITY): Payer: Medicare HMO | Admitting: Certified Registered Nurse Anesthetist

## 2015-09-01 ENCOUNTER — Encounter (HOSPITAL_COMMUNITY): Payer: Self-pay

## 2015-09-01 ENCOUNTER — Ambulatory Visit (HOSPITAL_COMMUNITY)
Admission: RE | Admit: 2015-09-01 | Discharge: 2015-09-01 | Disposition: A | Discharge status: Home / Self Care | Payer: Medicare HMO | Source: Ambulatory Visit | Attending: Gastroenterology | Admitting: Gastroenterology

## 2015-09-01 DIAGNOSIS — D123 Benign neoplasm of transverse colon: Secondary | ICD-10-CM | POA: Diagnosis not present

## 2015-09-01 DIAGNOSIS — E7800 Pure hypercholesterolemia, unspecified: Secondary | ICD-10-CM | POA: Diagnosis not present

## 2015-09-01 DIAGNOSIS — B182 Chronic viral hepatitis C: Secondary | ICD-10-CM | POA: Insufficient documentation

## 2015-09-01 DIAGNOSIS — Z86010 Personal history of colonic polyps: Secondary | ICD-10-CM | POA: Insufficient documentation

## 2015-09-01 DIAGNOSIS — K635 Polyp of colon: Secondary | ICD-10-CM | POA: Diagnosis not present

## 2015-09-01 DIAGNOSIS — J45909 Unspecified asthma, uncomplicated: Secondary | ICD-10-CM | POA: Diagnosis not present

## 2015-09-01 DIAGNOSIS — D122 Benign neoplasm of ascending colon: Secondary | ICD-10-CM | POA: Insufficient documentation

## 2015-09-01 DIAGNOSIS — Z1211 Encounter for screening for malignant neoplasm of colon: Secondary | ICD-10-CM | POA: Insufficient documentation

## 2015-09-01 HISTORY — PX: COLONOSCOPY WITH PROPOFOL: SHX5780

## 2015-09-01 SURGERY — COLONOSCOPY WITH PROPOFOL
Anesthesia: Monitor Anesthesia Care

## 2015-09-01 MED ORDER — PROPOFOL 10 MG/ML IV BOLUS
INTRAVENOUS | Status: AC
  Filled 2015-09-01: qty 40

## 2015-09-01 MED ORDER — PROPOFOL 10 MG/ML IV BOLUS
INTRAVENOUS | Status: DC | PRN
  Administered 2015-09-01: 20 mg via INTRAVENOUS
  Administered 2015-09-01: 30 mg via INTRAVENOUS
  Administered 2015-09-01: 10 mg via INTRAVENOUS
  Administered 2015-09-01 (×2): 20 mg via INTRAVENOUS
  Administered 2015-09-01 (×2): 30 mg via INTRAVENOUS
  Administered 2015-09-01: 50 mg via INTRAVENOUS
  Administered 2015-09-01: 30 mg via INTRAVENOUS
  Administered 2015-09-01: 10 mg via INTRAVENOUS
  Administered 2015-09-01: 20 mg via INTRAVENOUS
  Administered 2015-09-01: 40 mg via INTRAVENOUS
  Administered 2015-09-01: 30 mg via INTRAVENOUS
  Administered 2015-09-01: 40 mg via INTRAVENOUS

## 2015-09-01 MED ORDER — LIDOCAINE HCL (CARDIAC) 20 MG/ML IV SOLN
INTRAVENOUS | Status: DC | PRN
  Administered 2015-09-01: 50 mg via INTRAVENOUS

## 2015-09-01 MED ORDER — LACTATED RINGERS IV SOLN
INTRAVENOUS | Status: DC
  Administered 2015-09-01: 1000 mL via INTRAVENOUS

## 2015-09-01 MED ORDER — LIDOCAINE HCL (CARDIAC) 20 MG/ML IV SOLN
INTRAVENOUS | Status: AC
  Filled 2015-09-01: qty 5

## 2015-09-01 MED ORDER — SODIUM CHLORIDE 0.9 % IV SOLN: INTRAVENOUS | Status: DC

## 2015-09-01 SURGICAL SUPPLY — 21 items

## 2015-09-01 NOTE — Discharge Instructions (Signed)
Colonoscopy, Care After °Refer to this sheet in the next few weeks. These instructions provide you with information on caring for yourself after your procedure. Your health care provider may also give you more specific instructions. Your treatment has been planned according to current medical practices, but problems sometimes occur. Call your health care provider if you have any problems or questions after your procedure. °WHAT TO EXPECT AFTER THE PROCEDURE  °After your procedure, it is typical to have the following: °· A small amount of blood in your stool. °· Moderate amounts of gas and mild abdominal cramping or bloating. °HOME CARE INSTRUCTIONS °· Do not drive, operate machinery, or sign important documents for 24 hours. °· You may shower and resume your regular physical activities, but move at a slower pace for the first 24 hours. °· Take frequent rest periods for the first 24 hours. °· Walk around or put a warm pack on your abdomen to help reduce abdominal cramping and bloating. °· Drink enough fluids to keep your urine clear or pale yellow. °· You may resume your normal diet as instructed by your health care provider. Avoid heavy or fried foods that are hard to digest. °· Avoid drinking alcohol for 24 hours or as instructed by your health care provider. °· Only take over-the-counter or prescription medicines as directed by your health care provider. °· If a tissue sample (biopsy) was taken during your procedure: °¨ Do not take aspirin or blood thinners for 7 days, or as instructed by your health care provider. °¨ Do not drink alcohol for 7 days, or as instructed by your health care provider. °¨ Eat soft foods for the first 24 hours. °SEEK MEDICAL CARE IF: °You have persistent spotting of blood in your stool 2-3 days after the procedure. °SEEK IMMEDIATE MEDICAL CARE IF: °· You have more than a small spotting of blood in your stool. °· You pass large blood clots in your stool. °· Your abdomen is swollen  (distended). °· You have nausea or vomiting. °· You have a fever. °· You have increasing abdominal pain that is not relieved with medicine. °  °This information is not intended to replace advice given to you by your health care provider. Make sure you discuss any questions you have with your health care provider. °  °Document Released: 03/16/2004 Document Revised: 05/23/2013 Document Reviewed: 04/09/2013 °Elsevier Interactive Patient Education ©2016 [employer] ° °

## 2015-09-01 NOTE — Op Note (Signed)
Procedure: Surveillance colonoscopy. History of adenomatous colon polyps removed colonoscopically in the past.  Endoscopist: Earle Gell  Premedication: Propofol administered by anesthesia  Procedure: The patient was placed in the left lateral decubitus position. Anal inspection and digital rectal exam were normal. The Pentax pediatric colonoscope was introduced into the rectum and advanced to the cecum. A normal-appearing ileocecal valve and appendiceal orifice were identified. Colonic preparation for the exam today was good. Withdrawal time was 16 minutes  Rectum. Normal. Retroflex view of the distal rectum was normal  Sigmoid colon and descending colon. Normal  Splenic flexure. Normal  Transverse colon. A 5 mm sessile polyp was removed from the mid transverse colon with the cold snare and a 5 mm sessile polyp was removed from the proximal transverse colon with the cold snare  Hepatic flexure. Normal  Ascending colon. A 5 mm sessile polyp was removed from the distal ascending colon with the cold snare  Cecum and ileocecal valve. Normal  Assessment: Two small polyps were removed from the transverse colon and a small polyp was removed from the ascending colon. Otherwise normal colonoscopy  Recommendation: Schedule repeat surveillance colonoscopy in 5 years

## 2015-09-01 NOTE — Anesthesia Postprocedure Evaluation (Signed)
Anesthesia Post Note  Patient: [name]  Procedure(s) Performed: Procedure(s) (LRB): COLONOSCOPY WITH PROPOFOL (N/A)  Patient location during evaluation: PACU Anesthesia Type: MAC Level of consciousness: awake and alert Pain management: pain level controlled Vital Signs Assessment: post-procedure vital signs reviewed and stable Respiratory status: spontaneous breathing, nonlabored ventilation, respiratory function stable and patient connected to nasal cannula oxygen Cardiovascular status: blood pressure returned to baseline and stable Postop Assessment: no signs of nausea or vomiting Anesthetic complications: no    Last Vitals:  Filed Vitals:   09/01/15 1237  BP: 149/91  Pulse: 61  Temp: 36.7 C  Resp: 10    Last Pain: There were no vitals filed for this visit.               Wynetta Seith JENNETTE

## 2015-09-01 NOTE — Anesthesia Preprocedure Evaluation (Addendum)
Anesthesia Evaluation  Patient identified by MRN, date of birth, ID band Patient awake    Reviewed: Allergy & Precautions, NPO status , Patient's Chart, lab work & pertinent test results  History of Anesthesia Complications Negative for: history of anesthetic complications  Airway Mallampati: II  TM Distance: >3 FB Neck ROM: Full    Dental no notable dental hx. (+) Dental Advisory Given   Pulmonary asthma ,    Pulmonary exam normal breath sounds clear to auscultation       Cardiovascular negative cardio ROS Normal cardiovascular exam Rhythm:Regular Rate:Normal     Neuro/Psych negative neurological ROS  negative psych ROS   GI/Hepatic negative GI ROS, (+) Hepatitis -, C  Endo/Other  negative endocrine ROS  Renal/GU negative Renal ROS  negative genitourinary   Musculoskeletal  (+) Arthritis ,   Abdominal   Peds negative pediatric ROS (+)  Hematology negative hematology ROS (+)   Anesthesia Other Findings   Reproductive/Obstetrics negative OB ROS                             Anesthesia Physical Anesthesia Plan  ASA: III  Anesthesia Plan: MAC   Post-op Pain Management:    Induction: Intravenous  Airway Management Planned: Nasal Cannula  Additional Equipment:   Intra-op Plan:   Post-operative Plan:   Informed Consent: I have reviewed the patients History and Physical, chart, labs and discussed the procedure including the risks, benefits and alternatives for the proposed anesthesia with the patient or authorized representative who has indicated his/her understanding and acceptance.   Dental advisory given  Plan Discussed with: CRNA  Anesthesia Plan Comments:         Anesthesia Quick Evaluation

## 2015-09-01 NOTE — Transfer of Care (Signed)
Immediate Anesthesia Transfer of Care Note  Patient: [name]  Procedure(s) Performed: Procedure(s): COLONOSCOPY WITH PROPOFOL (N/A)  Patient Location: PACU  Anesthesia Type:MAC  Level of Consciousness: Patient easily awoken, sedated, comfortable, cooperative, following commands, responds to stimulation.   Airway & Oxygen Therapy: Patient spontaneously breathing, ventilating well, oxygen via simple oxygen mask.  Post-op Assessment: Report given to PACU RN, vital signs reviewed and stable, moving all extremities.   Post vital signs: Reviewed and stable.  Complications: No apparent anesthesia complications

## 2015-09-01 NOTE — H&P (Signed)
  Procedure: Surveillance colonoscopy. History of adenomatous colon polyp removed colonoscopically in the past. Chronic hepatitis C with elevated liver transaminases.  History: The patient is a 65 year old male born Jul 15, 1951. He is scheduled to undergo a surveillance colonoscopy today.  Past medical history: Chronic hepatitis C (genotype 1A) with elevated liver transaminases. Hypercholesterolemia. Left axillary lipoma removed surgically. Right ankle surgery. Tonsillectomy. Deviated septum surgery. Varicocele surgery.  Exam: The patient is alert and lying comfortably on the endoscopy stretcher. Abdomen is soft and nontender to palpation. Lungs are clear to auscultation. Cardiac exam reveals a regular rhythm.  Plan: Proceed with surveillance colonoscopy

## 2015-09-02 ENCOUNTER — Encounter (HOSPITAL_COMMUNITY): Payer: Self-pay | Admitting: Gastroenterology

## 2015-09-18 DIAGNOSIS — B1920 Unspecified viral hepatitis C without hepatic coma: Secondary | ICD-10-CM | POA: Diagnosis not present

## 2015-09-25 ENCOUNTER — Other Ambulatory Visit: Payer: Medicare Other

## 2015-09-25 DIAGNOSIS — B182 Chronic viral hepatitis C: Secondary | ICD-10-CM

## 2015-09-26 LAB — HEPATITIS B SURFACE ANTIGEN: Hepatitis B Surface Ag: NEGATIVE

## 2015-09-26 LAB — IRON: Iron: 149 ug/dL (ref 50–180)

## 2015-09-26 LAB — CBC WITH DIFFERENTIAL/PLATELET
BASOS PCT: 0 % (ref 0–1)
Basophils Absolute: 0 10*3/uL (ref 0.0–0.1)
EOS ABS: 0 10*3/uL (ref 0.0–0.7)
EOS PCT: 1 % (ref 0–5)
HEMATOCRIT: 43.7 % (ref 39.0–52.0)
Hemoglobin: 14.6 g/dL (ref 13.0–17.0)
LYMPHS PCT: 29 % (ref 12–46)
Lymphs Abs: 1.4 10*3/uL (ref 0.7–4.0)
MCH: 31.7 pg (ref 26.0–34.0)
MCHC: 33.4 g/dL (ref 30.0–36.0)
MCV: 95 fL (ref 78.0–100.0)
MONO ABS: 0.5 10*3/uL (ref 0.1–1.0)
MPV: 10.3 fL (ref 8.6–12.4)
Monocytes Relative: 11 % (ref 3–12)
NEUTROS ABS: 2.8 10*3/uL (ref 1.7–7.7)
NEUTROS PCT: 59 % (ref 43–77)
PLATELETS: 172 10*3/uL (ref 150–400)
RBC: 4.6 MIL/uL (ref 4.22–5.81)
RDW: 13.8 % (ref 11.5–15.5)
WBC: 4.8 10*3/uL (ref 4.0–10.5)

## 2015-09-26 LAB — PROTIME-INR
INR: 1.04 (ref <1.50)
Prothrombin Time: 13.7 seconds (ref 11.6–15.2)

## 2015-09-26 LAB — HEPATITIS B CORE ANTIBODY, TOTAL: HEP B C TOTAL AB: NONREACTIVE

## 2015-09-26 LAB — HIV ANTIBODY (ROUTINE TESTING W REFLEX): HIV 1&2 Ab, 4th Generation: NONREACTIVE

## 2015-09-26 LAB — HEPATITIS A ANTIBODY, TOTAL: HEP A TOTAL AB: REACTIVE — AB

## 2015-09-26 LAB — HEPATITIS B SURFACE ANTIBODY,QUALITATIVE: Hep B S Ab: NEGATIVE

## 2015-09-26 LAB — ANA: ANA: NEGATIVE

## 2015-09-30 LAB — HCV RNA, QUANT REAL-TIME PCR W/REFLEX
HCV RNA, PCR, QN (LOG): 6.71 {Log_IU}/mL — AB
HCV RNA, PCR, QN: 5170000 [IU]/mL — AB

## 2015-09-30 LAB — HCV RNA,LIPA RFLX NS5A DRUG RESIST

## 2015-10-03 LAB — HCV RNA NS5A DRUG RESISTANCE

## 2015-10-21 DIAGNOSIS — Z0100 Encounter for examination of eyes and vision without abnormal findings: Secondary | ICD-10-CM | POA: Diagnosis not present

## 2015-10-21 DIAGNOSIS — R197 Diarrhea, unspecified: Secondary | ICD-10-CM | POA: Diagnosis not present

## 2015-10-22 ENCOUNTER — Encounter: Payer: Self-pay | Admitting: Internal Medicine

## 2015-10-22 ENCOUNTER — Ambulatory Visit (INDEPENDENT_AMBULATORY_CARE_PROVIDER_SITE_OTHER): Payer: Medicare HMO | Admitting: Internal Medicine

## 2015-10-22 VITALS — BP 159/110 | HR 76 | Temp 97.8°F | Ht 72.0 in | Wt 187.0 lb

## 2015-10-22 DIAGNOSIS — B182 Chronic viral hepatitis C: Secondary | ICD-10-CM | POA: Diagnosis not present

## 2015-10-22 DIAGNOSIS — Z23 Encounter for immunization: Secondary | ICD-10-CM | POA: Diagnosis not present

## 2015-10-22 DIAGNOSIS — R197 Diarrhea, unspecified: Secondary | ICD-10-CM | POA: Diagnosis not present

## 2015-10-22 MED ORDER — ELBASVIR-GRAZOPREVIR 50-100 MG PO TABS: 1.0000 | ORAL_TABLET | Freq: Every day | ORAL | Status: DC

## 2015-10-22 MED ORDER — RIBAVIRIN 200 MG PO TABS: 1200.0000 mg/d | ORAL_TABLET | Freq: Two times a day (BID) | ORAL | Status: DC

## 2015-10-22 NOTE — Patient Instructions (Signed)
Date 10/22/2015  Dear [name], As discussed in the [facility], your hepatitis C therapy will include the following medications:          Zepatier (elbasvir 50 mg/grazoprevir 100 mg) for 16 weeks with ribavirin   Please note that ALL MEDICATIONS WILL START ON THE SAME DATE for a total of 16 weeks. ---------------------------------------------------------------- Your HCV Treatment Start Date: TBA   Your HCV genotype:  1a    Liver Fibrosis: TBD    ---------------------------------------------------------------- YOUR PHARMACY CONTACT:   [place] [facility] [place] Phone: [phone] Hours: Monday to Friday 7:30 am to 6:00 pm   Please always contact your pharmacy at least 3-4 business days before you run out of medications to ensure your next month's medication is ready or 1 week prior to running out if you receive it by mail.  Remember, each prescription is for 28 days. ---------------------------------------------------------------- GENERAL NOTES REGARDING YOUR HEPATITIS C MEDICATION:  ZEPATIER is available as a beige-colored, oval-shaped, film-coated tablet debossed with "770" on one side and plain on the other. Each tablet contains 50 mg elbasvir and 100 mg grazoprevir.  Common side effects of ZEPATIER when used without ribavirin include: - feeling tired -trouble sleeping - headache -diarrhea - nausea  Common side effects of ZEPATIER when used with ribavirin include: - low red blood cell counts (anemia) - feeling irritable - headache - stomach pain - feeling tired - depression - shortness of breath - joint pain - rash or itching  Please note that this only lists the most common side effects and is NOT a comprehensive list of the potential side effects of these medications. For more information, please review the drug information sheets that come with your medication package from the pharmacy.   ---------------------------------------------------------------- GENERAL HELPFUL HINTS ON HCV THERAPY: 1. Stay well-hydrated. 2. Notify the [facility] of any changes in your other over-the-counter/herbal or prescription medications. 3. If you miss a dose of your medication, take the missed dose as soon as you remember. Return to your regular time/dose schedule the next day.  4.  Do not stop taking your medications without first talking with your healthcare provider. 5.  You may take Tylenol (acetaminophen), as long as the dose is less than 2000 mg (OR no more than 4 tablets of the Tylenol Extra Strengths 500mg  tablet) in 24 hours. 6.  You will see our pharmacist-specialist within the first 2 weeks of starting your medication. 7.  You will need to obtain routine labs around week 4 and12 weeks after starting and then 3 to 6 months after finishing Zepatier.   8.  If ribavirin is part of your regimen, you also will have a lab visit within 2 weeks.   Scharlene Gloss, [place] [facility] [place] [address] [place], [place]  [phone]

## 2015-10-22 NOTE — Addendum Note (Signed)
Addended by: Myrtis Hopping A on: 10/22/2015 04:59 PM   Modules accepted: Orders

## 2015-10-22 NOTE — Progress Notes (Signed)
[facility]   CC: consideration for treatment for chronic hepatitis C  HPI:  +[name] is a 65 y.o. male who presents for initial evaluation and management of chronic hepatitis C.  Patient tested positive in 1990.  He donated blood then and in 2002 was informed of the positive test. Hepatitis C-associated risk factors present are: none. Patient denies intranasal drug use, IV drug abuse. Patient has had other studies performed. Results: hepatitis C RNA by PCR, result: positive. Patient has not had prior treatment for Hepatitis C. Patient does not have a past history of liver disease. Patient does not have a family history of liver disease. Patient does not  have associated signs or symptoms related to liver disease.  Labs reviewed and confirm chronic hepatitis C with a positive viral load.   Records reviewed from previous labs and positive HCV.  He did have a Fibroscan in [place] about 4 years ago but does not remember result, other than "not bad".        Patient does have documented immunity to Hepatitis A. Patient does not have documented immunity to Hepatitis B.    Review of Systems:  Constitutional: negative for fatigue Musculoskeletal: negative for myalgias and arthralgias All other systems reviewed and are negative      Past Medical History  Diagnosis Date  . Hepatitis C   . Hyperlipemia   . Lipoma of arm     left  . DJD (degenerative joint disease)     right ankle, rt hand  . Asthma     as child    Prior to Admission medications   Medication Sig Start Date End Date Taking? Authorizing Provider  albuterol (PROVENTIL HFA;VENTOLIN HFA) 108 (90 BASE) MCG/ACT inhaler Inhale 2 puffs into the lungs every 6 (six) hours as needed for wheezing or shortness of breath.    Yes Historical Provider, MD  b complex vitamins tablet Take 1 tablet by mouth daily.   Yes Historical Provider, MD  celecoxib (CELEBREX) 200 MG capsule Take 200 mg by mouth  daily as needed for mild pain or moderate pain (joint pain).   Yes Historical Provider, MD  cetirizine (ZYRTEC) 10 MG tablet Take 10 mg by mouth at bedtime as needed for allergies.   Yes Historical Provider, MD  Cholecalciferol (VITAMIN D3) 2000 units TABS Take 1 tablet by mouth daily.   Yes Historical Provider, MD  naproxen sodium (ANAPROX) 220 MG tablet Take 440 mg by mouth daily as needed (pain).    Yes Historical Provider, MD  Omega-3 Fatty Acids (FISH OIL) 1200 MG CAPS Take 2 capsules by mouth daily.   Yes Historical Provider, MD  Red Yeast Rice 600 MG TABS Take 600 mg by mouth 2 (two) times daily.   Yes Historical Provider, MD  diclofenac sodium (VOLTAREN) 1 % GEL Apply 2 g topically 2 (two) times daily as needed (muscle aches). Reported on 10/22/2015    Historical Provider, MD  Elbasvir-Grazoprevir (ZEPATIER) 50-100 MG TABS Take 1 tablet by mouth daily. 10/22/15   Thayer Headings, MD  ribavirin (COPEGUS) 200 MG tablet Take 3 tablets (600 mg total) by mouth 2 (two) times daily. 10/22/15   Thayer Headings, MD    No Known Allergies  Social History  Substance Use Topics  . Smoking status: Never Smoker   . Smokeless tobacco: Never Used  . Alcohol Use: Yes     Comment: 3 beers day or 1/2 glass of wine  Family History  Problem Relation Age of Onset  . Coronary artery disease Father   . Heart disease Father   . Stroke Mother   . Lung cancer Paternal Grandmother   . Arthritis Maternal Grandfather   . Cancer Maternal Grandfather     lung  . Cancer Sister     breast      Objective:  Constitutional: in no apparent distress and alert,  Filed Vitals:   10/22/15 1441  BP: 159/110  Pulse: 76  Temp: 97.8 F (36.6 C)   Eyes: anicteric Cardiovascular: Cor RRR and No murmurs Respiratory: CTA B; normal respiratory effort Gastrointestinal: Bowel sounds are normal, liver is not enlarged, spleen is not enlarged Musculoskeletal: peripheral pulses normal, no pedal edema, no clubbing or  cyanosis Skin: negatives: no rash; no porphyria cutanea tarda Lymphatic: no cervical lymphadenopathy   Laboratory Genotype: No results found for: HCVGENOTYPE HCV viral load: No results found for: HCVQUANT Lab Results  Component Value Date   WBC 4.8 09/25/2015   HGB 14.6 09/25/2015   HCT 43.7 09/25/2015   MCV 95.0 09/25/2015   PLT 172 09/25/2015   No results found for: CREATININE, BUN, NA, K, CL, CO2 No results found for: ALT, AST, GGT, ALKPHOS   Labs and history reviewed and show CHILD-PUGH A  5-6 points: Child class A 7-9 points: Child class B 10-15 points: Child class C  Lab Results  Component Value Date   INR 1.04 09/25/2015     Assessment: New Patient with Chronic Hepatitis C genotype 1a, untreated.  I discussed with the patient the lab findings that confirm chronic hepatitis C as well as the natural history and progression of disease including about 30% of people who develop cirrhosis of the liver if left untreated and once cirrhosis is established there is a 2-7% risk per year of liver cancer and liver failure.  I discussed the importance of treatment and benefits in reducing the risk, even if significant liver fibrosis exists.   He has baseline NS5A resistance  Plan: 1) Patient counseled extensively on limiting acetaminophen to no more than 2 grams daily, avoidance of alcohol. 2) Transmission discussed with patient including sexual transmission, sharing razors and toothbrush.   3) Will need referral to gastroenterology if concern for cirrhosis 4) Will need referral for substance abuse counseling: No.; Further work up to include urine drug screen  No. 5) Will prescribe Zepatier 16 weeks with ribavirin due to NS5A resistance.  Though the guidelines support using Harvoni for 12 weeks without ribavirin and no resistance assay, with known resistance, I feel his best chance of cure will be with Zepatier and ribavirin for 16 weeks rather than 12 weeks of Harvoni alone.   6)  Hepatitis A vaccine No. 7) Hepatitis B vaccine Yes.   8) Pneumovax vaccine if concern for cirrhosis 9) Further work up to include liver staging with elastography 10) NS5A test  Yes.   11) will follow up after starting medication

## 2015-10-23 ENCOUNTER — Ambulatory Visit (HOSPITAL_COMMUNITY)
Admission: RE | Admit: 2015-10-23 | Discharge: 2015-10-23 | Disposition: A | Discharge status: Home / Self Care | Payer: Medicare HMO | Source: Ambulatory Visit | Attending: Internal Medicine | Admitting: Internal Medicine

## 2015-10-23 DIAGNOSIS — R161 Splenomegaly, not elsewhere classified: Secondary | ICD-10-CM | POA: Insufficient documentation

## 2015-10-23 DIAGNOSIS — B182 Chronic viral hepatitis C: Secondary | ICD-10-CM | POA: Diagnosis not present

## 2015-10-23 MED FILL — RIBAVIRIN 200 MG TABLET: 200 | 28 days supply | Qty: 168 | Fill #0

## 2015-10-24 MED FILL — *ZEPATIER 50-100 MG TABLET: 50-100 | 28 days supply | Qty: 28 | Fill #0

## 2015-10-27 ENCOUNTER — Encounter: Payer: Self-pay | Admitting: Pharmacy Technician

## 2015-11-11 MED FILL — *ZEPATIER 50-100 MG TABLET: 50-100 | 28 days supply | Qty: 28 | Fill #1

## 2015-11-11 MED FILL — RIBAVIRIN 200 MG TABLET: 200 | 28 days supply | Qty: 168 | Fill #1

## 2015-11-12 ENCOUNTER — Ambulatory Visit (INDEPENDENT_AMBULATORY_CARE_PROVIDER_SITE_OTHER): Payer: Medicare HMO | Admitting: Pharmacist Clinician (PhC)/ Clinical Pharmacy Specialist

## 2015-11-12 ENCOUNTER — Ambulatory Visit: Payer: Self-pay

## 2015-11-12 ENCOUNTER — Other Ambulatory Visit: Payer: Self-pay

## 2015-11-12 DIAGNOSIS — B182 Chronic viral hepatitis C: Secondary | ICD-10-CM | POA: Diagnosis not present

## 2015-11-12 NOTE — Progress Notes (Signed)
Patient ID: [name], male   DOB: 18-Sep-1950, 65 y.o.   MRN: [id number] HPI: [name] is a 65 y.o. male who is here for his pharmacy visit with hep C.   No results found for: HCVGENOTYPE, HEPCGENOTYPE  Allergies: No Known Allergies  Vitals:    Past Medical History: Past Medical History  Diagnosis Date  . Hepatitis C   . Hyperlipemia   . Lipoma of arm     left  . DJD (degenerative joint disease)     right ankle, rt hand  . Asthma     as child    Social History: Social History   Social History  . Marital Status: Married    Spouse Name: N/A  . Number of Children: N/A  . Years of Education: N/A   Social History Main Topics  . Smoking status: Never Smoker   . Smokeless tobacco: Never Used  . Alcohol Use: Yes     Comment: 3 beers day or 1/2 glass of wine  . Drug Use: No  . Sexual Activity: Not on file   Other Topics Concern  . Not on file   Social History Narrative    Labs: HEP B S AB (no units)  Date Value  09/25/2015 NEG   HEPATITIS B SURFACE AG (no units)  Date Value  09/25/2015 NEGATIVE    No results found for: HCVGENOTYPE, HEPCGENOTYPE  No flowsheet data found.  INR (no units)  Date Value  09/25/2015 1.04    CrCl: CrCl cannot be calculated (Unknown ideal weight.).  Fibrosis Score: F3/4 as assessed by ARFI  Child-Pugh Score: Class A  Previous Treatment Regimen: Naive  Assessment: [name] started on his Zepatier/riba on 3/10 for his hep C. The plan is to treat for 16 wks due to his baseline NS5A (Q30Q/H). He is treatment naive. He has not missed any doses so far and no side effects. No interaction issues noted. We are going to get a CBC today. He will come back early next month for the Hep B vaccine. Will schedule hep C VL and CBC at that time also. Appt with Dr. Linus Salmons has also been made. All of his questions were answered.   Recommendations:  Cont zepatier 1 PO qday Cont Ribavirin 600mg  PO BID CBC today VL/CBC in April Dr.  Linus Salmons in May  Pham, Minh [place], [place].D., BCPS, AAHIVP Clinical Infectious [facility] 11/12/2015, 2:12 PM

## 2015-11-13 ENCOUNTER — Telehealth: Payer: Self-pay | Admitting: Pharmacist Clinician (PhC)/ Clinical Pharmacy Specialist

## 2015-11-13 LAB — CBC
HCT: 32.2 % — ABNORMAL LOW (ref 39.0–52.0)
HEMOGLOBIN: 10.7 g/dL — AB (ref 13.0–17.0)
MCH: 31.6 pg (ref 26.0–34.0)
MCHC: 33.2 g/dL (ref 30.0–36.0)
MCV: 95 fL (ref 78.0–100.0)
MPV: 10.1 fL (ref 8.6–12.4)
PLATELETS: 190 10*3/uL (ref 150–400)
RBC: 3.39 MIL/uL — AB (ref 4.22–5.81)
RDW: 14.1 % (ref 11.5–15.5)
WBC: 4.8 10*3/uL (ref 4.0–10.5)

## 2015-11-13 NOTE — Telephone Encounter (Signed)
[name] came in for a CBC yesterday since he is on ribavirin. I told him to call me back today to ask about the results. He hgb 14.6>>10.7. D/w Dr. Linus Salmons and we are going to reduce the ribavirin to 600mg  qday. Message was relayed to him and he understood. He is going to be out of town next week. He he is coming back here on 4/10 for hep B vaccine so we are going to get CBC and VL at that time.

## 2015-11-24 ENCOUNTER — Other Ambulatory Visit: Payer: Medicare HMO

## 2015-11-24 ENCOUNTER — Ambulatory Visit (INDEPENDENT_AMBULATORY_CARE_PROVIDER_SITE_OTHER): Payer: Medicare HMO

## 2015-11-24 DIAGNOSIS — Z23 Encounter for immunization: Secondary | ICD-10-CM

## 2015-11-24 DIAGNOSIS — B182 Chronic viral hepatitis C: Secondary | ICD-10-CM | POA: Diagnosis not present

## 2015-11-24 LAB — CBC
HCT: 41.2 % (ref 38.5–50.0)
Hemoglobin: 13.5 g/dL (ref 13.2–17.1)
MCH: 33.2 pg — AB (ref 27.0–33.0)
MCHC: 32.8 g/dL (ref 32.0–36.0)
MCV: 101.2 fL — AB (ref 80.0–100.0)
MPV: 10 fL (ref 7.5–12.5)
PLATELETS: 216 10*3/uL (ref 140–400)
RBC: 4.07 MIL/uL — ABNORMAL LOW (ref 4.20–5.80)
RDW: 15.1 % — AB (ref 11.0–15.0)
WBC: 5.3 10*3/uL (ref 3.8–10.8)

## 2015-11-24 MED ORDER — HEPATITIS B VAC RECOMBINANT 10 MCG/ML IJ SUSP: 1.0000 mL | Freq: Once | INTRAMUSCULAR | Status: DC

## 2015-11-25 ENCOUNTER — Telehealth: Payer: Self-pay | Admitting: Pharmacist Clinician (PhC)/ Clinical Pharmacy Specialist

## 2015-11-25 ENCOUNTER — Other Ambulatory Visit: Payer: Self-pay | Admitting: Pharmacist Clinician (PhC)/ Clinical Pharmacy Specialist

## 2015-11-25 DIAGNOSIS — B182 Chronic viral hepatitis C: Secondary | ICD-10-CM

## 2015-11-25 LAB — HEPATITIS C RNA QUANTITATIVE: HCV QUANT: NOT DETECTED [IU]/mL (ref <15)

## 2015-11-25 NOTE — Telephone Encounter (Signed)
CBC is back and Hgb is back up to 13.5. D/w Dr. Linus Salmons and we are going to increase the dose back to 600mg  BID. [name] is going to start it today and going to come back in 2 wks for another CBC.

## 2015-12-03 ENCOUNTER — Telehealth: Payer: Self-pay | Admitting: Pharmacist Clinician (PhC)/ Clinical Pharmacy Specialist

## 2015-12-04 NOTE — Telephone Encounter (Signed)
error 

## 2015-12-08 MED FILL — *ZEPATIER 50-100 MG TABLET: 50-100 | 28 days supply | Qty: 28 | Fill #2

## 2015-12-08 MED FILL — RIBAVIRIN 200 MG TABLET: 200 | 28 days supply | Qty: 168 | Fill #2

## 2015-12-09 ENCOUNTER — Other Ambulatory Visit: Payer: Medicare HMO

## 2015-12-09 DIAGNOSIS — B182 Chronic viral hepatitis C: Secondary | ICD-10-CM | POA: Diagnosis not present

## 2015-12-09 LAB — CBC
HEMATOCRIT: 40.5 % (ref 38.5–50.0)
Hemoglobin: 13.4 g/dL (ref 13.2–17.1)
MCH: 33 pg (ref 27.0–33.0)
MCHC: 33.1 g/dL (ref 32.0–36.0)
MCV: 99.8 fL (ref 80.0–100.0)
MPV: 10.4 fL (ref 7.5–12.5)
Platelets: 197 10*3/uL (ref 140–400)
RBC: 4.06 MIL/uL — AB (ref 4.20–5.80)
RDW: 14.2 % (ref 11.0–15.0)
WBC: 6.2 10*3/uL (ref 3.8–10.8)

## 2015-12-11 ENCOUNTER — Telehealth: Payer: Self-pay | Admitting: *Deleted

## 2015-12-11 NOTE — Telephone Encounter (Signed)
-----   Message from Thayer Headings, MD sent at 12/11/2015  8:30 AM EDT ----- Please let him know his recent CBC was great, no anemia, so he can continue with his ribavirin twice a day as it is.  thanks

## 2015-12-11 NOTE — Telephone Encounter (Signed)
Patient notified

## 2015-12-22 ENCOUNTER — Telehealth: Payer: Self-pay | Admitting: Pharmacist Clinician (PhC)/ Clinical Pharmacy Specialist

## 2015-12-22 NOTE — Telephone Encounter (Signed)
Khi called about if it's ok to take VS-C (herbal supplements) for his shingles. He stated that it worked for him in the past. Told him that we don't have a way to check the interaction since it has mainly herbal supplements in there. Rather him seeing his primary for Valtrex for it instead.

## 2015-12-30 ENCOUNTER — Encounter: Payer: Self-pay | Admitting: Internal Medicine

## 2015-12-30 ENCOUNTER — Ambulatory Visit (INDEPENDENT_AMBULATORY_CARE_PROVIDER_SITE_OTHER): Payer: Medicare HMO | Admitting: Internal Medicine

## 2015-12-30 VITALS — BP 169/75 | HR 80 | Temp 97.7°F | Wt 188.0 lb

## 2015-12-30 DIAGNOSIS — K7400 Hepatic fibrosis, unspecified: Secondary | ICD-10-CM

## 2015-12-30 DIAGNOSIS — B182 Chronic viral hepatitis C: Secondary | ICD-10-CM | POA: Diagnosis not present

## 2015-12-30 DIAGNOSIS — K7460 Unspecified cirrhosis of liver: Secondary | ICD-10-CM | POA: Insufficient documentation

## 2015-12-30 LAB — CBC WITH DIFFERENTIAL/PLATELET
Basophils Absolute: 49 cells/uL (ref 0–200)
Basophils Relative: 1 %
EOS PCT: 1 %
Eosinophils Absolute: 49 cells/uL (ref 15–500)
HCT: 39.8 % (ref 38.5–50.0)
HEMOGLOBIN: 12.9 g/dL — AB (ref 13.2–17.1)
LYMPHS ABS: 1372 {cells}/uL (ref 850–3900)
Lymphocytes Relative: 28 %
MCH: 32.9 pg (ref 27.0–33.0)
MCHC: 32.4 g/dL (ref 32.0–36.0)
MCV: 101.5 fL — ABNORMAL HIGH (ref 80.0–100.0)
MPV: 10.1 fL (ref 7.5–12.5)
Monocytes Absolute: 490 cells/uL (ref 200–950)
Monocytes Relative: 10 %
NEUTROS PCT: 60 %
Neutro Abs: 2940 cells/uL (ref 1500–7800)
Platelets: 205 10*3/uL (ref 140–400)
RBC: 3.92 MIL/uL — AB (ref 4.20–5.80)
RDW: 13.6 % (ref 11.0–15.0)
WBC: 4.9 10*3/uL (ref 3.8–10.8)

## 2015-12-30 NOTE — Assessment & Plan Note (Signed)
Will need GI evaluation with likely EGD after treatment completion.  Will discuss in August visit.

## 2015-12-30 NOTE — Progress Notes (Signed)
   Subjective:    Patient ID: [name], male    DOB: November 12, 1950, 65 y.o.   MRN: [id number]  HPI Here for follow up of HCV.    On Zepatier and ribavirin and doing well.  Getting 16 weeks and more than half way through.  Initially Hgb ok then dropped but repeat back to normal suggesting lab error and now good again.  Went down to 600 mg ribavirin for about 2 weeks and now back up to 1200 mg divided bid again.  No issues.     Review of Systems  Constitutional: Negative for fatigue.  Skin: Negative for rash.  Neurological: Negative for dizziness and headaches.       Objective:   Physical Exam  Constitutional: He appears well-developed and well-nourished. No distress.  Eyes: No scleral icterus.  Cardiovascular: Normal rate, regular rhythm and normal heart sounds.   Pulmonary/Chest: Effort normal and breath sounds normal.  Skin: No rash noted.          Assessment & Plan:

## 2015-12-30 NOTE — Assessment & Plan Note (Signed)
Will check hgb again today and rtc 1 month with PharmD for repeat Hgb and then end of treatment viral load in August with me after.

## 2016-01-05 MED FILL — RIBAVIRIN 200 MG TABLET: 200 | 28 days supply | Qty: 168 | Fill #3

## 2016-01-05 MED FILL — *ZEPATIER 50-100 MG TABLET: 50-100 | 28 days supply | Qty: 28 | Fill #3

## 2016-02-02 ENCOUNTER — Ambulatory Visit: Payer: Medicare HMO | Admitting: Pharmacist Clinician (PhC)/ Clinical Pharmacy Specialist

## 2016-02-02 ENCOUNTER — Other Ambulatory Visit: Payer: Self-pay

## 2016-02-02 DIAGNOSIS — B182 Chronic viral hepatitis C: Secondary | ICD-10-CM

## 2016-02-02 LAB — CBC
HCT: 39.6 % (ref 38.5–50.0)
Hemoglobin: 12.8 g/dL — ABNORMAL LOW (ref 13.2–17.1)
MCH: 33.2 pg — AB (ref 27.0–33.0)
MCHC: 32.3 g/dL (ref 32.0–36.0)
MCV: 102.6 fL — ABNORMAL HIGH (ref 80.0–100.0)
MPV: 10.1 fL (ref 7.5–12.5)
PLATELETS: 196 10*3/uL (ref 140–400)
RBC: 3.86 MIL/uL — AB (ref 4.20–5.80)
RDW: 13.5 % (ref 11.0–15.0)
WBC: 4.7 10*3/uL (ref 3.8–10.8)

## 2016-02-02 NOTE — Progress Notes (Signed)
HPI: [name] is a 65 y.o. male who is here for his pharmacy visit for hep C f/u.   No results found for: HCVGENOTYPE, HEPCGENOTYPE  Allergies: No Known Allergies  Vitals:    Past Medical History: Past Medical History  Diagnosis Date  . Hepatitis C   . Hyperlipemia   . Lipoma of arm     left  . DJD (degenerative joint disease)     right ankle, rt hand  . Asthma     as child    Social History: Social History   Social History  . Marital Status: Married    Spouse Name: N/A  . Number of Children: N/A  . Years of Education: N/A   Social History Main Topics  . Smoking status: Never Smoker   . Smokeless tobacco: Never Used  . Alcohol Use: Yes     Comment: 3 beers day or 1/2 glass of wine  . Drug Use: No  . Sexual Activity: Not on file   Other Topics Concern  . Not on file   Social History Narrative    Labs: HEP B S AB (no units)  Date Value  09/25/2015 NEG   HEPATITIS B SURFACE AG (no units)  Date Value  09/25/2015 NEGATIVE    No results found for: HCVGENOTYPE, HEPCGENOTYPE  Hepatitis C RNA quantitative Latest Ref Rng 11/24/2015  HCV Quantitative <15 IU/mL Not Detected  HCV Quantitative Log <1.18 log 10 NOT CALC    INR (no units)  Date Value  09/25/2015 1.04    CrCl: CrCl cannot be calculated (Unknown ideal weight.).  Fibrosis Score: F3/4 as assessed by ARFI  Child-Pugh Score:  Class A  Previous Treatment Regimen: None  Assessment: [name] is here for his last pharmacy visit. He is 10 days out from finishing 16 wks of Zepatier/riba. He is doing very well on it and has not missed any doses. We are going to get a CBC today. He is due to for an end of treatment VL in August and f/u Dr. Linus Salmons a week later.   Recommendations:  Elizebeth Koller out Zepatier/riba CBC today VL in August then Dr. Ludwig Lean, [place], [place].D., BCPS, AAHIVP Clinical Infectious [facility] 02/02/2016, 10:51 AM

## 2016-02-26 DIAGNOSIS — Z789 Other specified health status: Secondary | ICD-10-CM | POA: Diagnosis not present

## 2016-02-26 DIAGNOSIS — R030 Elevated blood-pressure reading, without diagnosis of hypertension: Secondary | ICD-10-CM | POA: Diagnosis not present

## 2016-02-26 DIAGNOSIS — B1920 Unspecified viral hepatitis C without hepatic coma: Secondary | ICD-10-CM | POA: Diagnosis not present

## 2016-03-17 DIAGNOSIS — R69 Illness, unspecified: Secondary | ICD-10-CM | POA: Diagnosis not present

## 2016-03-18 ENCOUNTER — Other Ambulatory Visit: Payer: Medicare HMO

## 2016-03-18 DIAGNOSIS — B182 Chronic viral hepatitis C: Secondary | ICD-10-CM | POA: Diagnosis not present

## 2016-03-19 LAB — HEPATITIS C RNA QUANTITATIVE: HCV QUANT: NOT DETECTED [IU]/mL (ref <15)

## 2016-04-01 ENCOUNTER — Encounter: Payer: Self-pay | Admitting: Internal Medicine

## 2016-04-01 ENCOUNTER — Ambulatory Visit (INDEPENDENT_AMBULATORY_CARE_PROVIDER_SITE_OTHER): Payer: Medicare HMO | Admitting: Internal Medicine

## 2016-04-01 ENCOUNTER — Telehealth: Payer: Self-pay | Admitting: *Deleted

## 2016-04-01 VITALS — BP 161/83 | HR 80 | Temp 97.7°F | Wt 188.0 lb

## 2016-04-01 DIAGNOSIS — Z23 Encounter for immunization: Secondary | ICD-10-CM | POA: Diagnosis not present

## 2016-04-01 DIAGNOSIS — B182 Chronic viral hepatitis C: Secondary | ICD-10-CM | POA: Diagnosis not present

## 2016-04-01 DIAGNOSIS — K7460 Unspecified cirrhosis of liver: Secondary | ICD-10-CM | POA: Diagnosis not present

## 2016-04-01 NOTE — Assessment & Plan Note (Signed)
Will need #3 next month.

## 2016-04-01 NOTE — Telephone Encounter (Signed)
Called patient to notify him of appt with [facility], Dr. Wynetta Emery, for 04/15/16 at 11:00 AM. [name]

## 2016-04-01 NOTE — Progress Notes (Signed)
   Subjective:    Patient ID: [name], male    DOB: 08-25-1950, 65 y.o.   MRN: [id number]  HPI Here for follow up of HCV.    Now has completed 16 weeks of Zepatier and ribavirin and did well.  Early viral load and now end of treatment viral load not detected.   Elastography with F3/4.  Getting hepatitis B series, #3 next month.  No weight loss.  No alcohol.    Review of Systems  Constitutional: Negative for fatigue.  Skin: Negative for rash.  Neurological: Negative for dizziness and headaches.       Objective:   Physical Exam  Constitutional: He appears well-developed and well-nourished. No distress.  Eyes: No scleral icterus.  Cardiovascular: Normal rate, regular rhythm and normal heart sounds.   Pulmonary/Chest: Effort normal and breath sounds normal.  Skin: No rash noted.    Social History   Social History  . Marital status: Married    Spouse name: N/A  . Number of children: N/A  . Years of education: N/A   Occupational History  . Not on file.   Social History Main Topics  . Smoking status: Never Smoker  . Smokeless tobacco: Never Used  . Alcohol use Yes     Comment: 3 beers day or 1/2 glass of wine  . Drug use: No  . Sexual activity: Not on file   Other Topics Concern  . Not on file   Social History Narrative  . No narrative on file        Assessment & Plan:

## 2016-04-01 NOTE — Assessment & Plan Note (Addendum)
I will refer him back to Dr. Wynetta Emery for consideration of EGD.  Pneumovax next visit.

## 2016-04-01 NOTE — Assessment & Plan Note (Signed)
SVR12 next visit.

## 2016-04-20 ENCOUNTER — Telehealth: Payer: Self-pay | Admitting: *Deleted

## 2016-04-20 NOTE — Telephone Encounter (Signed)
Per patient's insurance, [employer] [phone] option 3, no prior authorization is needed for an ultrasound if in network. They stated that [facility] is in network. Myrtis Hopping

## 2016-04-22 ENCOUNTER — Ambulatory Visit (INDEPENDENT_AMBULATORY_CARE_PROVIDER_SITE_OTHER): Payer: Medicare HMO

## 2016-04-22 ENCOUNTER — Ambulatory Visit
Admission: RE | Admit: 2016-04-22 | Discharge: 2016-04-22 | Disposition: A | Discharge status: Home / Self Care | Payer: Medicare HMO | Source: Ambulatory Visit | Attending: Internal Medicine | Admitting: Internal Medicine

## 2016-04-22 DIAGNOSIS — K7460 Unspecified cirrhosis of liver: Secondary | ICD-10-CM | POA: Diagnosis not present

## 2016-04-22 DIAGNOSIS — Z23 Encounter for immunization: Secondary | ICD-10-CM

## 2016-04-22 NOTE — Progress Notes (Signed)
Patient in to receive third Hep B shot. Given in the left deltoid. Tolerated well. Walked to pharmacy to speak with staff. Rodman Key, LPN

## 2016-04-26 ENCOUNTER — Ambulatory Visit: Payer: Self-pay

## 2016-05-03 ENCOUNTER — Telehealth: Payer: Self-pay | Admitting: *Deleted

## 2016-05-03 NOTE — Telephone Encounter (Signed)
Please advise on ultrasound results. Patient also asking for information/results/tests for life insurance policy. RN advised patient to sign up for MyChart so he can see his results that way.  Landis Gandy, RN

## 2016-05-03 NOTE — Telephone Encounter (Signed)
Relayed results from Dr. Linus Salmons, mailed paper copy for his records at patient's request. "If you could let him know his recent screening ultrasound looked good, no concerns. thanks "

## 2016-05-03 NOTE — Telephone Encounter (Signed)
-----   Message from Orpah Melter, CPhT sent at 05/03/2016 10:58 AM EDT ----- Left a voicemail asking for liver ultrasound results from 04/22/16

## 2016-06-07 DIAGNOSIS — J209 Acute bronchitis, unspecified: Secondary | ICD-10-CM | POA: Diagnosis not present

## 2016-06-07 DIAGNOSIS — J069 Acute upper respiratory infection, unspecified: Secondary | ICD-10-CM | POA: Diagnosis not present

## 2016-06-11 DIAGNOSIS — L989 Disorder of the skin and subcutaneous tissue, unspecified: Secondary | ICD-10-CM | POA: Diagnosis not present

## 2016-07-12 ENCOUNTER — Telehealth: Payer: Self-pay | Admitting: *Deleted

## 2016-07-12 NOTE — Telephone Encounter (Signed)
Lab work appt 07/19/16 - having ankle surgery 07/20/16.  Not able to keep f/u appt with Dr. Linus Salmons on 08/02/16 due to ankle surgery.  Requesting that the lab results be called to him.  Dr. Linus Salmons do you want the patient to make a new f/u appt?

## 2016-07-12 NOTE — Telephone Encounter (Signed)
Left message for the patient with Dr. Henreitta Leber response.  Requested patient call for a return appointment in 6 months.

## 2016-07-12 NOTE — Telephone Encounter (Signed)
That's fine.  We can let him know of results.  I would like him to come back in 6 months to repeat his ultrasound.  thanks

## 2016-07-19 ENCOUNTER — Other Ambulatory Visit: Payer: Medicare HMO

## 2016-07-19 DIAGNOSIS — B182 Chronic viral hepatitis C: Secondary | ICD-10-CM | POA: Diagnosis not present

## 2016-07-20 DIAGNOSIS — Q661 Congenital talipes calcaneovarus: Secondary | ICD-10-CM | POA: Diagnosis not present

## 2016-07-20 LAB — HEPATITIS C RNA QUANTITATIVE: HCV Quantitative: NOT DETECTED [IU]/mL

## 2016-07-26 ENCOUNTER — Other Ambulatory Visit: Payer: Self-pay

## 2016-07-26 DIAGNOSIS — K5900 Constipation, unspecified: Secondary | ICD-10-CM | POA: Diagnosis not present

## 2016-07-30 DIAGNOSIS — M19032 Primary osteoarthritis, left wrist: Secondary | ICD-10-CM | POA: Diagnosis not present

## 2016-07-30 DIAGNOSIS — M19031 Primary osteoarthritis, right wrist: Secondary | ICD-10-CM | POA: Diagnosis not present

## 2016-08-02 ENCOUNTER — Ambulatory Visit: Payer: Self-pay | Admitting: Internal Medicine

## 2016-08-12 ENCOUNTER — Telehealth: Payer: Self-pay | Admitting: *Deleted

## 2016-08-12 DIAGNOSIS — K7460 Unspecified cirrhosis of liver: Secondary | ICD-10-CM

## 2016-08-12 NOTE — Telephone Encounter (Signed)
Patient called as previously discussed for his lab results. Results confirmed (he already viewed them on MyChart).  He is supposed to have a screening ultrasound in 6 months per Dr Henreitta Leber notes. RN placed order, needs cosign.  Patient transferred to radiology scheduling to make this appointment (5/24).  No precert required per [facility] (reference [id number]).  He will follow up here after that ultrasound (5/29).  Pt aware of all. Landis Gandy, RN

## 2016-10-02 DIAGNOSIS — J209 Acute bronchitis, unspecified: Secondary | ICD-10-CM | POA: Diagnosis not present

## 2016-10-02 DIAGNOSIS — R062 Wheezing: Secondary | ICD-10-CM | POA: Diagnosis not present

## 2016-10-22 DIAGNOSIS — B029 Zoster without complications: Secondary | ICD-10-CM | POA: Diagnosis not present

## 2016-10-30 DIAGNOSIS — H04301 Unspecified dacryocystitis of right lacrimal passage: Secondary | ICD-10-CM | POA: Diagnosis not present

## 2016-11-23 ENCOUNTER — Other Ambulatory Visit: Payer: Self-pay | Admitting: Internal Medicine

## 2017-01-06 ENCOUNTER — Ambulatory Visit (HOSPITAL_COMMUNITY): Payer: Medicare HMO

## 2017-01-06 DIAGNOSIS — A932 Colorado tick fever: Secondary | ICD-10-CM | POA: Diagnosis not present

## 2017-01-06 DIAGNOSIS — S30861A Insect bite (nonvenomous) of abdominal wall, initial encounter: Secondary | ICD-10-CM | POA: Diagnosis not present

## 2017-01-06 DIAGNOSIS — S70361A Insect bite (nonvenomous), right thigh, initial encounter: Secondary | ICD-10-CM | POA: Diagnosis not present

## 2017-01-10 IMAGING — US US ABDOMEN LIMITED
1 series · 14 of 25 positions shown · non-contrast
Comparison: 10/23/2015

CLINICAL DATA: Hepatic cirrhosis, unspecified hepatic cirrhosis
type (HCC) HR7.C8 (441-MR-CM). chronic hepatitis-C.

EXAM:
US ABDOMEN LIMITED - RIGHT UPPER QUADRANT

[Series 1: us abdomen limited · 0.32mm/px · 14 of 41 slices shown]
[im 1/41]
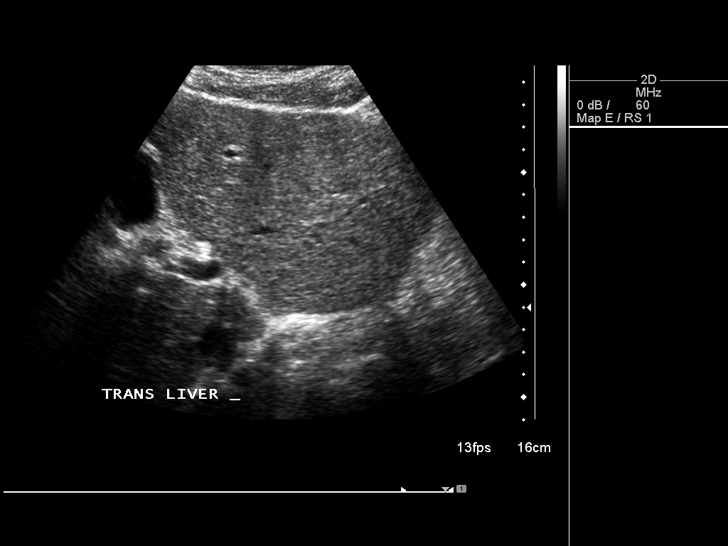
[im 4/41]
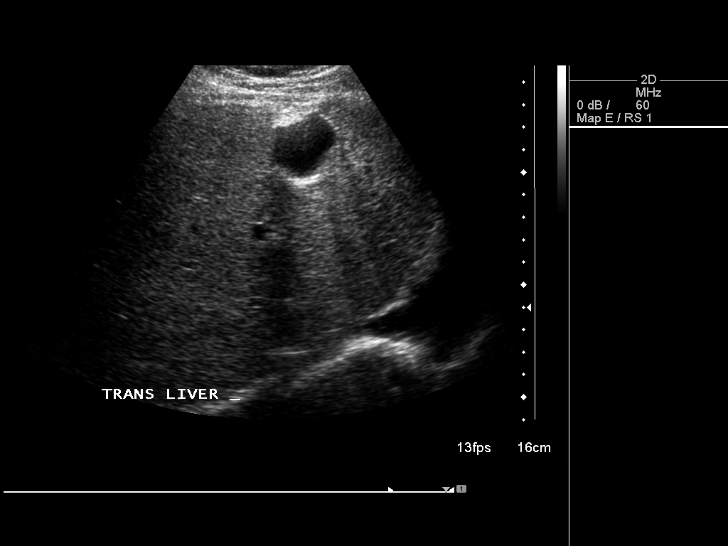
[im 7/41]
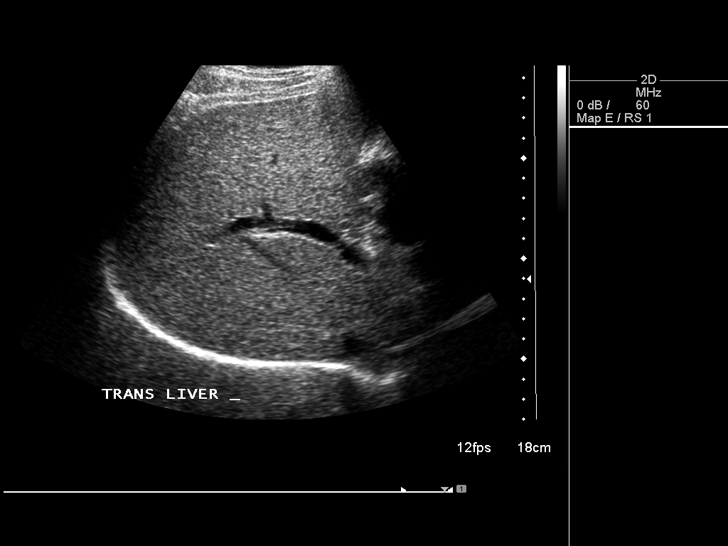
[im 11/41]
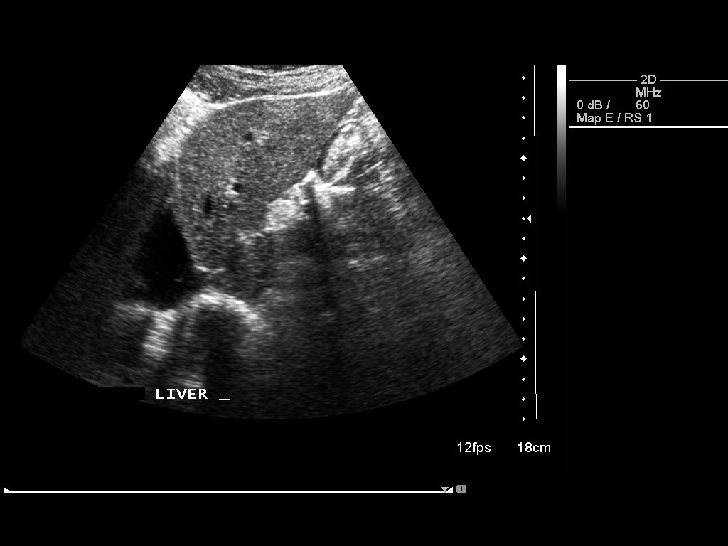
[im 14/41]
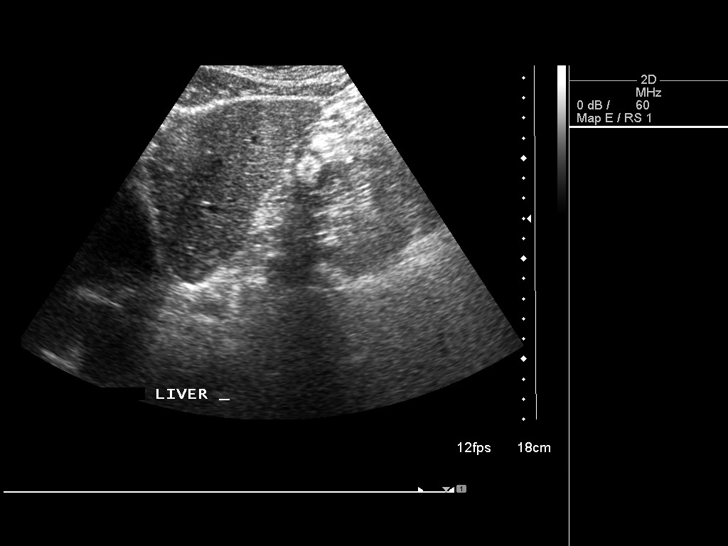
[im 16/41]
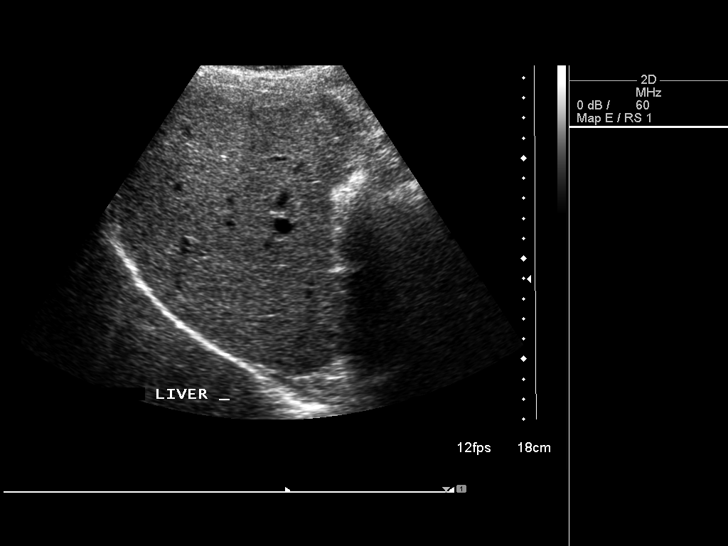
[im 19/41]
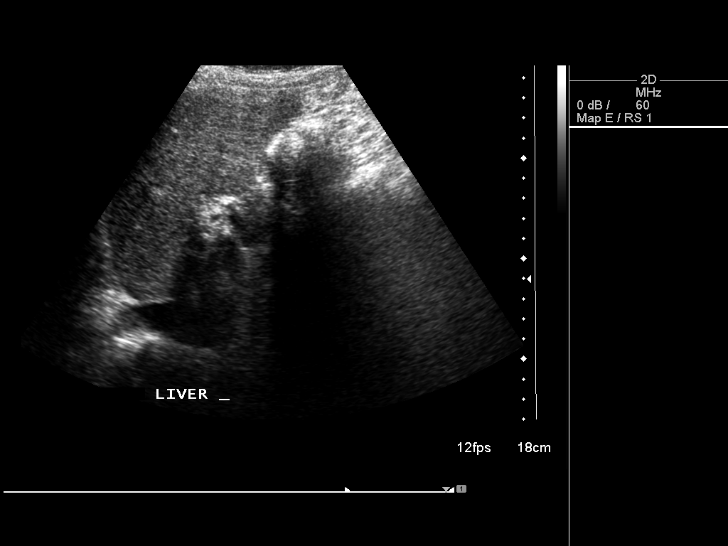
[im 22/41]
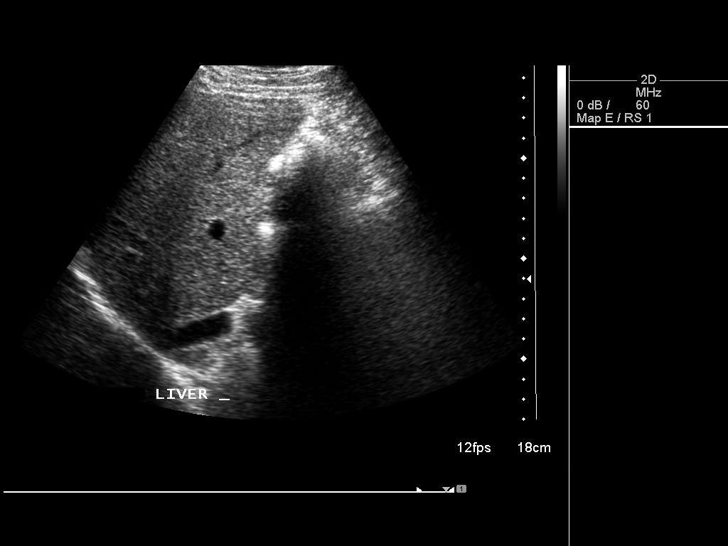
[im 26/41]
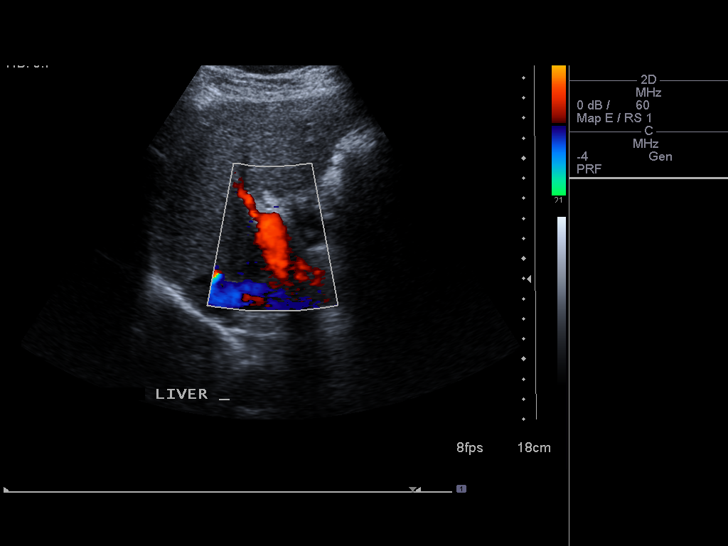
[im 27/41]
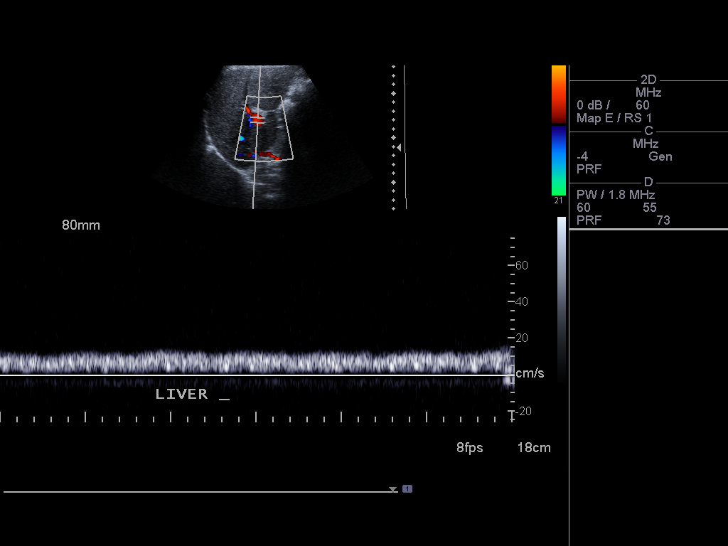
[im 31/41]
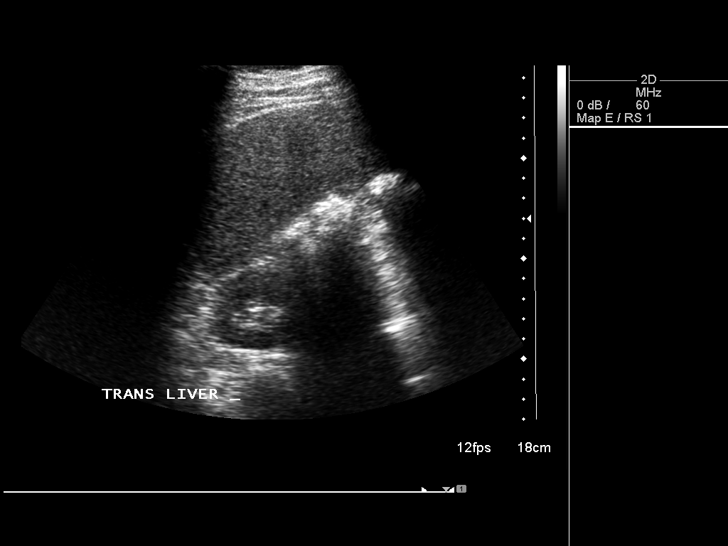
[im 34/41]
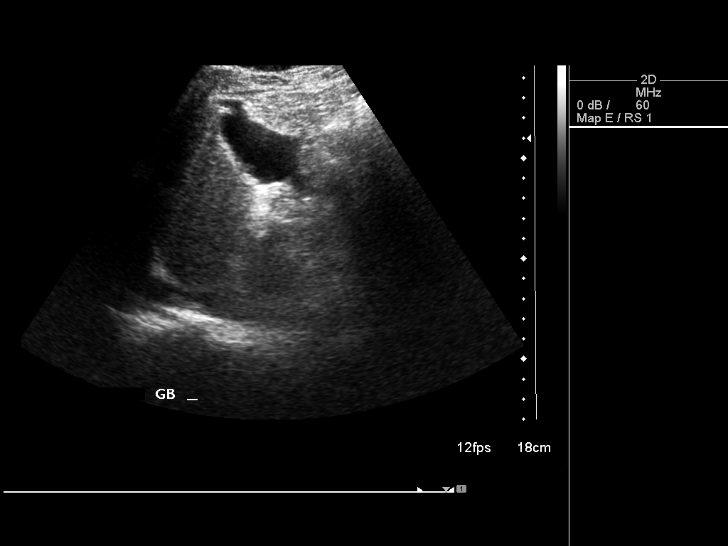
[im 37/41]
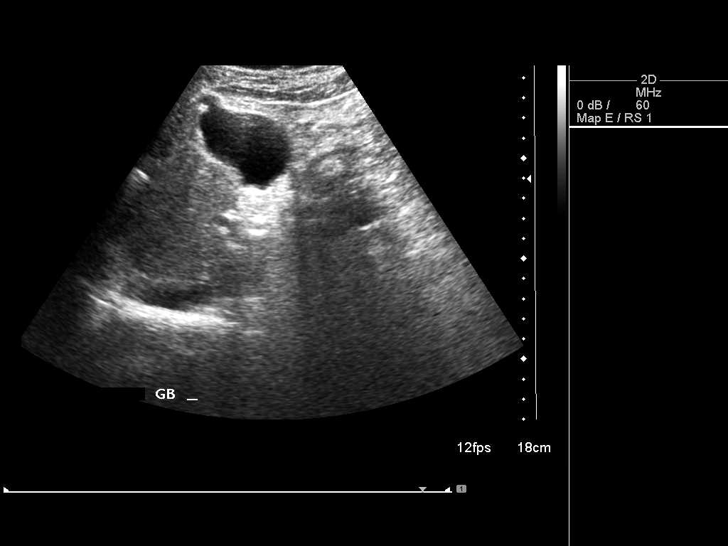
[im 41/41]
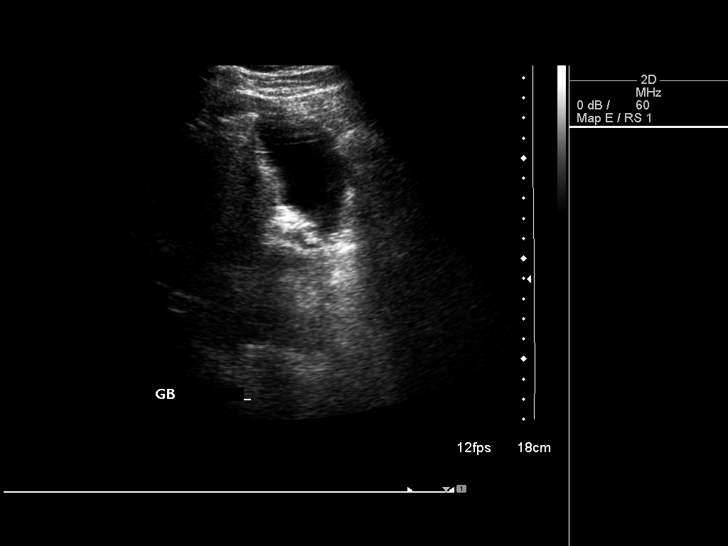

[14 of 25 positions shown; findings below may reference images not displayed]

FINDINGS: Gallbladder:

No gallstones or wall thickening visualized. No sonographic Murphy
sign noted by sonographer.

Common bile duct:

Diameter: 4.9 mm

Liver:

No focal lesion identified. Within normal limits in parenchymal
echogenicity.
IMPRESSION: Normal right upper quadrant ultrasound.  No liver mass.

## 2017-01-11 ENCOUNTER — Encounter: Payer: Self-pay | Admitting: Internal Medicine

## 2017-01-11 ENCOUNTER — Ambulatory Visit (INDEPENDENT_AMBULATORY_CARE_PROVIDER_SITE_OTHER): Payer: Medicare HMO | Admitting: Internal Medicine

## 2017-01-11 ENCOUNTER — Ambulatory Visit (HOSPITAL_COMMUNITY)
Admission: RE | Admit: 2017-01-11 | Discharge: 2017-01-11 | Disposition: A | Discharge status: Home / Self Care | Payer: Medicare HMO | Source: Ambulatory Visit | Attending: Internal Medicine | Admitting: Internal Medicine

## 2017-01-11 VITALS — BP 154/93 | HR 74 | Temp 97.8°F | Ht 72.0 in | Wt 185.0 lb

## 2017-01-11 DIAGNOSIS — Z789 Other specified health status: Secondary | ICD-10-CM

## 2017-01-11 DIAGNOSIS — K7460 Unspecified cirrhosis of liver: Secondary | ICD-10-CM | POA: Diagnosis not present

## 2017-01-11 DIAGNOSIS — Z7289 Other problems related to lifestyle: Secondary | ICD-10-CM

## 2017-01-11 DIAGNOSIS — Z1389 Encounter for screening for other disorder: Secondary | ICD-10-CM | POA: Insufficient documentation

## 2017-01-11 NOTE — Assessment & Plan Note (Addendum)
No overt cirrhosis on ultrasound.  Negative for mass.  Will continue with [place] screening every 6 months.  Discussed reasons for screening.   He can rtc in 1 year.

## 2017-01-11 NOTE — Assessment & Plan Note (Signed)
Occasional. Advised to limit alcohol due to advanced fibrosis.

## 2017-01-11 NOTE — Progress Notes (Signed)
   Subjective:    Patient ID: [name], male    DOB: 25-Jun-1951, 66 y.o.   MRN: [id number]  HPI Here for follow up of advanced liver fibrosis/cirrhosis.   Completed hepatitis C treatment and SVR 24 negative.  Elastography with F3/4.   No new issues, no associated rashes.    Review of Systems  Constitutional: Negative for fatigue.  Skin: Negative for rash.  Neurological: Negative for dizziness and headaches.       Objective:   Physical Exam  Constitutional: He appears well-developed and well-nourished. No distress.  Eyes: No scleral icterus.  Cardiovascular: Normal rate, regular rhythm and normal heart sounds.   Pulmonary/Chest: Effort normal and breath sounds normal.  Skin: No rash noted.          Assessment & Plan:

## 2017-02-23 DIAGNOSIS — S61412A Laceration without foreign body of left hand, initial encounter: Secondary | ICD-10-CM | POA: Diagnosis not present

## 2017-02-23 DIAGNOSIS — S62397B Other fracture of fifth metacarpal bone, left hand, initial encounter for open fracture: Secondary | ICD-10-CM | POA: Diagnosis not present

## 2017-02-23 DIAGNOSIS — S62397A Other fracture of fifth metacarpal bone, left hand, initial encounter for closed fracture: Secondary | ICD-10-CM | POA: Diagnosis not present

## 2017-02-23 DIAGNOSIS — X58XXXA Exposure to other specified factors, initial encounter: Secondary | ICD-10-CM | POA: Diagnosis not present

## 2017-02-23 DIAGNOSIS — S62617A Displaced fracture of proximal phalanx of left little finger, initial encounter for closed fracture: Secondary | ICD-10-CM | POA: Diagnosis not present

## 2017-02-23 DIAGNOSIS — Z23 Encounter for immunization: Secondary | ICD-10-CM | POA: Diagnosis not present

## 2017-03-02 DIAGNOSIS — S62639A Displaced fracture of distal phalanx of unspecified finger, initial encounter for closed fracture: Secondary | ICD-10-CM | POA: Diagnosis not present

## 2017-03-22 DIAGNOSIS — M19032 Primary osteoarthritis, left wrist: Secondary | ICD-10-CM | POA: Diagnosis not present

## 2017-06-16 DIAGNOSIS — H25042 Posterior subcapsular polar age-related cataract, left eye: Secondary | ICD-10-CM | POA: Diagnosis not present

## 2017-07-04 DIAGNOSIS — H52203 Unspecified astigmatism, bilateral: Secondary | ICD-10-CM | POA: Diagnosis not present

## 2017-07-04 DIAGNOSIS — H2512 Age-related nuclear cataract, left eye: Secondary | ICD-10-CM | POA: Diagnosis not present

## 2017-07-04 DIAGNOSIS — H2511 Age-related nuclear cataract, right eye: Secondary | ICD-10-CM | POA: Diagnosis not present

## 2017-07-04 DIAGNOSIS — H25042 Posterior subcapsular polar age-related cataract, left eye: Secondary | ICD-10-CM | POA: Diagnosis not present

## 2017-07-04 DIAGNOSIS — H25041 Posterior subcapsular polar age-related cataract, right eye: Secondary | ICD-10-CM | POA: Diagnosis not present

## 2017-07-04 DIAGNOSIS — H524 Presbyopia: Secondary | ICD-10-CM | POA: Diagnosis not present

## 2017-07-04 DIAGNOSIS — H5213 Myopia, bilateral: Secondary | ICD-10-CM | POA: Diagnosis not present

## 2017-07-15 DIAGNOSIS — Z0000 Encounter for general adult medical examination without abnormal findings: Secondary | ICD-10-CM | POA: Diagnosis not present

## 2017-07-15 DIAGNOSIS — Z125 Encounter for screening for malignant neoplasm of prostate: Secondary | ICD-10-CM | POA: Diagnosis not present

## 2017-07-15 DIAGNOSIS — E785 Hyperlipidemia, unspecified: Secondary | ICD-10-CM | POA: Diagnosis not present

## 2017-07-27 DIAGNOSIS — Z125 Encounter for screening for malignant neoplasm of prostate: Secondary | ICD-10-CM | POA: Diagnosis not present

## 2017-07-27 DIAGNOSIS — E785 Hyperlipidemia, unspecified: Secondary | ICD-10-CM | POA: Diagnosis not present

## 2017-07-27 DIAGNOSIS — R7301 Impaired fasting glucose: Secondary | ICD-10-CM | POA: Diagnosis not present

## 2017-07-27 DIAGNOSIS — Z23 Encounter for immunization: Secondary | ICD-10-CM | POA: Diagnosis not present

## 2017-08-12 ENCOUNTER — Ambulatory Visit
Admission: RE | Admit: 2017-08-12 | Discharge: 2017-08-12 | Disposition: A | Discharge status: Home / Self Care | Payer: Medicare HMO | Source: Ambulatory Visit | Attending: Internal Medicine | Admitting: Internal Medicine

## 2017-08-12 DIAGNOSIS — K7460 Unspecified cirrhosis of liver: Secondary | ICD-10-CM

## 2017-08-12 DIAGNOSIS — B188 Other chronic viral hepatitis: Secondary | ICD-10-CM | POA: Diagnosis not present

## 2017-08-17 ENCOUNTER — Other Ambulatory Visit: Payer: Self-pay | Admitting: Internal Medicine

## 2017-08-17 ENCOUNTER — Telehealth: Payer: Self-pay | Admitting: *Deleted

## 2017-08-17 DIAGNOSIS — K7460 Unspecified cirrhosis of liver: Secondary | ICD-10-CM

## 2017-08-17 NOTE — Telephone Encounter (Signed)
-----   Message from Thayer Headings, MD sent at 08/17/2017  7:31 AM EST ----- Please let him know his ultasound looks fine and should repeat it in 6 months and he can follow up with me after that for his yearly follow up.  thanks

## 2017-08-17 NOTE — Telephone Encounter (Signed)
Patient advised and I will call him in five months to schedule his ultrasound. Myrtis Hopping

## 2017-08-23 ENCOUNTER — Telehealth: Payer: Self-pay | Admitting: *Deleted

## 2017-08-23 NOTE — Telephone Encounter (Signed)
Patient returned call and confirmed [place] appt.

## 2017-08-23 NOTE — Telephone Encounter (Signed)
Thanks Denise! 

## 2017-08-23 NOTE — Telephone Encounter (Signed)
Called patient and left a voice mail to inform him of ultrasound appt for February 13, 2018 at 7:45 AM at [facility]. Asked that he call the triage line to verify he received this appt info. If this time is not good for him, please have him call [phone] to reschedule. Patient will need to be fasting after midnight the night before the ultrasound. Myrtis Hopping CMA

## 2017-08-25 DIAGNOSIS — H25812 Combined forms of age-related cataract, left eye: Secondary | ICD-10-CM | POA: Diagnosis not present

## 2017-08-25 DIAGNOSIS — H25042 Posterior subcapsular polar age-related cataract, left eye: Secondary | ICD-10-CM | POA: Diagnosis not present

## 2017-08-25 DIAGNOSIS — H2512 Age-related nuclear cataract, left eye: Secondary | ICD-10-CM | POA: Diagnosis not present

## 2017-09-13 DIAGNOSIS — H25041 Posterior subcapsular polar age-related cataract, right eye: Secondary | ICD-10-CM | POA: Diagnosis not present

## 2017-09-13 DIAGNOSIS — H2511 Age-related nuclear cataract, right eye: Secondary | ICD-10-CM | POA: Diagnosis not present

## 2017-09-22 DIAGNOSIS — H52203 Unspecified astigmatism, bilateral: Secondary | ICD-10-CM | POA: Diagnosis not present

## 2017-09-22 DIAGNOSIS — H25041 Posterior subcapsular polar age-related cataract, right eye: Secondary | ICD-10-CM | POA: Diagnosis not present

## 2017-09-22 DIAGNOSIS — H524 Presbyopia: Secondary | ICD-10-CM | POA: Diagnosis not present

## 2017-09-22 DIAGNOSIS — H2511 Age-related nuclear cataract, right eye: Secondary | ICD-10-CM | POA: Diagnosis not present

## 2017-09-22 DIAGNOSIS — H25811 Combined forms of age-related cataract, right eye: Secondary | ICD-10-CM | POA: Diagnosis not present

## 2017-10-13 DIAGNOSIS — J069 Acute upper respiratory infection, unspecified: Secondary | ICD-10-CM | POA: Diagnosis not present

## 2017-10-25 DIAGNOSIS — R69 Illness, unspecified: Secondary | ICD-10-CM | POA: Diagnosis not present

## 2017-10-31 DIAGNOSIS — R69 Illness, unspecified: Secondary | ICD-10-CM | POA: Diagnosis not present

## 2017-11-01 DIAGNOSIS — R69 Illness, unspecified: Secondary | ICD-10-CM | POA: Diagnosis not present

## 2017-11-16 DIAGNOSIS — R69 Illness, unspecified: Secondary | ICD-10-CM | POA: Diagnosis not present

## 2018-01-02 DIAGNOSIS — R69 Illness, unspecified: Secondary | ICD-10-CM | POA: Diagnosis not present

## 2018-01-30 DIAGNOSIS — R69 Illness, unspecified: Secondary | ICD-10-CM | POA: Diagnosis not present

## 2018-02-09 ENCOUNTER — Telehealth: Payer: Self-pay

## 2018-02-09 NOTE — Telephone Encounter (Signed)
PT called today regarding a [place] of abdomen stated that he spoke with his insurance [employer] and that they would recommend that a PA be done through the insurance provider line [phone]. Spoke with our referral coordinator to see if she would be able to do this Marena Chancy stated she would work on it for the pt today. [name]

## 2018-02-13 ENCOUNTER — Ambulatory Visit
Admission: RE | Admit: 2018-02-13 | Discharge: 2018-02-13 | Disposition: A | Discharge status: Home / Self Care | Payer: Medicare HMO | Source: Ambulatory Visit | Attending: Internal Medicine | Admitting: Internal Medicine

## 2018-02-13 DIAGNOSIS — K7460 Unspecified cirrhosis of liver: Secondary | ICD-10-CM

## 2018-02-13 DIAGNOSIS — K7689 Other specified diseases of liver: Secondary | ICD-10-CM | POA: Diagnosis not present

## 2018-02-17 ENCOUNTER — Telehealth: Payer: Self-pay | Admitting: *Deleted

## 2018-02-17 ENCOUNTER — Other Ambulatory Visit: Payer: Self-pay | Admitting: Internal Medicine

## 2018-02-17 DIAGNOSIS — K7460 Unspecified cirrhosis of liver: Secondary | ICD-10-CM

## 2018-02-17 NOTE — Telephone Encounter (Signed)
Patient called for ultrasound results.  Last seen 12/2016, no appointments scheduled with Dr Linus Salmons.  Please advise on ultrasound results and follow up plan as needed. Landis Gandy, RN

## 2018-02-17 NOTE — Telephone Encounter (Signed)
Looks good, no issues.  I see him yearly with ultrasounds twice a year.  If you could set up the next in 6 months with follow up after that.  Order is in.   thanks

## 2018-05-30 DIAGNOSIS — J069 Acute upper respiratory infection, unspecified: Secondary | ICD-10-CM | POA: Diagnosis not present

## 2018-05-30 DIAGNOSIS — R05 Cough: Secondary | ICD-10-CM | POA: Diagnosis not present

## 2018-08-11 NOTE — Telephone Encounter (Signed)
Patient scheduled with [facility] on Wednesday 1/8, 8:00 (arrive 7:40, NPO after midnight), following up with Dr Linus Salmons Thursday 1/9 at 1:45.  RN left message for patient with appointment information and phone numbers to reschedule [phone], [phone]). Will send to [place] to see if the ultrasound needs prior authorization. Landis Gandy, RN

## 2018-08-21 ENCOUNTER — Telehealth: Payer: Self-pay | Admitting: Behavioral Health

## 2018-08-21 NOTE — Telephone Encounter (Signed)
Patient called stating he was going to cancel his upcoming appointment with Dr. Linus Salmons and his Ultrasound.  He states financially he cannot pay for it right now and she states he had been fine for the last few visit. Pricilla Riffle RN

## 2018-08-21 NOTE — Telephone Encounter (Signed)
Ok. I am ok if he simply gets the ultrasound and no appt, that is all that matters.  As he knows, it is screening for liver cancer since he has cirrhosis and will always need twice a year.  Of course, that is up to him.  Might be worth making this a phone note.   thanks

## 2018-08-21 NOTE — Telephone Encounter (Signed)
Called patient back and informed him per Dr. Linus Salmons that his twice a year ultrasounds are to screen for Liver cancer since he has cirrhosis.  Informed him he will always need twice a year ultrasounds.  Patient verbalized understanding and states he will go to the upcoming ultrasound 08/23/2018. Pricilla Riffle RN

## 2018-08-23 ENCOUNTER — Ambulatory Visit
Admission: RE | Admit: 2018-08-23 | Discharge: 2018-08-23 | Disposition: A | Discharge status: Home / Self Care | Payer: Medicare PPO | Source: Ambulatory Visit | Attending: Internal Medicine | Admitting: Internal Medicine

## 2018-08-23 DIAGNOSIS — K7460 Unspecified cirrhosis of liver: Secondary | ICD-10-CM

## 2018-08-24 ENCOUNTER — Ambulatory Visit: Payer: Medicare PPO | Admitting: Internal Medicine

## 2018-08-28 ENCOUNTER — Telehealth: Payer: Self-pay | Admitting: *Deleted

## 2018-08-28 NOTE — Telephone Encounter (Signed)
Per [place] called the patient and advised of the ultrasound results and the follow up recommendations. He advised he will think about where he wants to follow up and asked if once a year is reasonable. Advised him to call the office once he makes a decision and we will get him on the schedule.

## 2018-08-28 NOTE — Telephone Encounter (Signed)
Patient called to get the results of his recent ultrasound. Advised will ask the provider and give him a call back.

## 2018-08-28 NOTE — Telephone Encounter (Signed)
His ultrasound was fine, no new masses.  He just needs to continue to get an ultrasound every 6 months.  I know he missed his appt with me last week.  I think cost is an issue.  He can ask his PCP to order the ultrasounds going forward and just see me if there is an issue, if that helps him.

## 2019-05-28 DIAGNOSIS — I10 Essential (primary) hypertension: Secondary | ICD-10-CM | POA: Diagnosis not present

## 2019-05-30 DIAGNOSIS — I10 Essential (primary) hypertension: Secondary | ICD-10-CM | POA: Diagnosis not present

## 2019-05-30 DIAGNOSIS — Z23 Encounter for immunization: Secondary | ICD-10-CM | POA: Diagnosis not present

## 2019-06-19 ENCOUNTER — Telehealth: Payer: Self-pay

## 2019-06-19 NOTE — Telephone Encounter (Signed)
Patient called office today requesting a letter from [employer] stating he no longer's has hep c. States that he needs this for disability paper work. Will forward message to MD to advise on letter. [place]

## 2019-06-19 NOTE — Telephone Encounter (Signed)
Ok to write a letter that he has been "successfully treated for chronic hepatitis C in 2018 and is no longer active."

## 2019-06-21 ENCOUNTER — Other Ambulatory Visit: Payer: Self-pay

## 2019-10-12 DIAGNOSIS — Z03818 Encounter for observation for suspected exposure to other biological agents ruled out: Secondary | ICD-10-CM | POA: Diagnosis not present

## 2019-10-29 DIAGNOSIS — I10 Essential (primary) hypertension: Secondary | ICD-10-CM | POA: Diagnosis not present

## 2019-10-29 DIAGNOSIS — B001 Herpesviral vesicular dermatitis: Secondary | ICD-10-CM | POA: Diagnosis not present

## 2019-10-29 DIAGNOSIS — M62830 Muscle spasm of back: Secondary | ICD-10-CM | POA: Diagnosis not present

## 2019-10-29 DIAGNOSIS — E785 Hyperlipidemia, unspecified: Secondary | ICD-10-CM | POA: Diagnosis not present

## 2019-11-17 IMAGING — US US ABDOMEN LIMITED
1 series · 14 of 25 positions shown · non-contrast
Comparison: 01/11/2017

CLINICAL DATA: Cirrhosis.  Hepatitis-C screening.

EXAM:
ULTRASOUND ABDOMEN LIMITED RIGHT UPPER QUADRANT

[Series 1: us abdomen limited · 0.25mm/px · 14 of 37 slices shown]
[im 1/37]
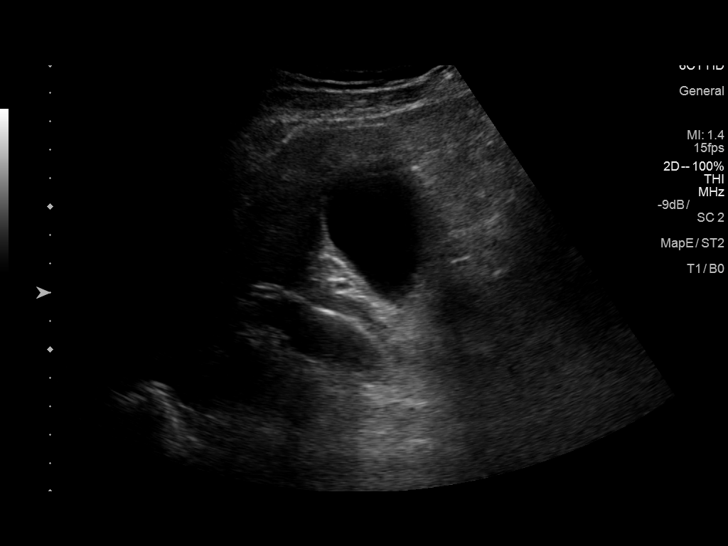
[im 4/37]
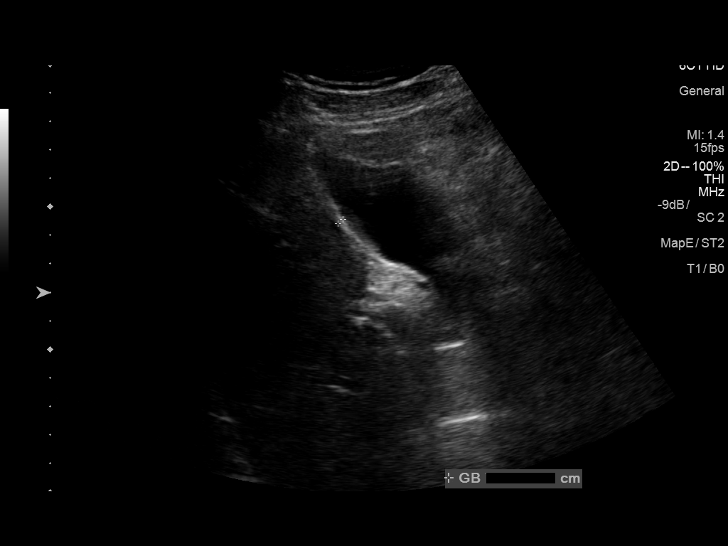
[im 7/37]
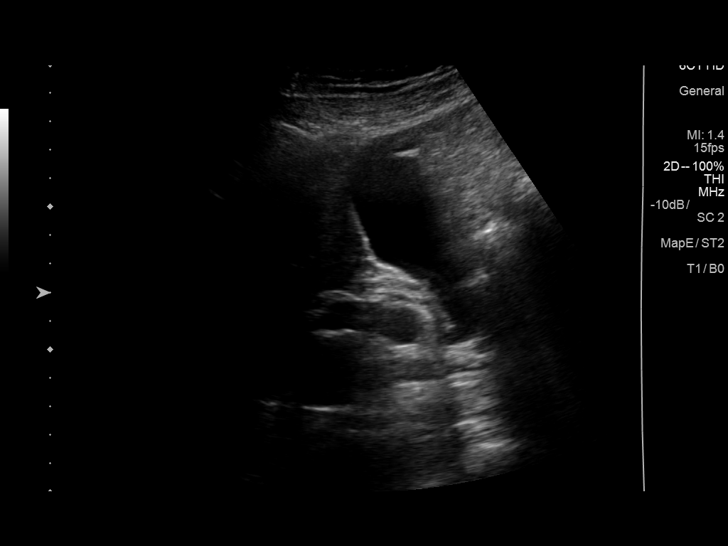
[im 10/37]
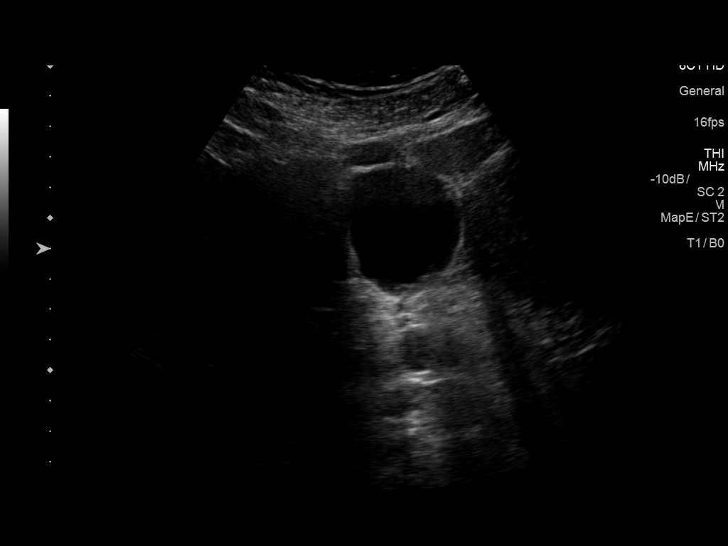
[im 13/37]
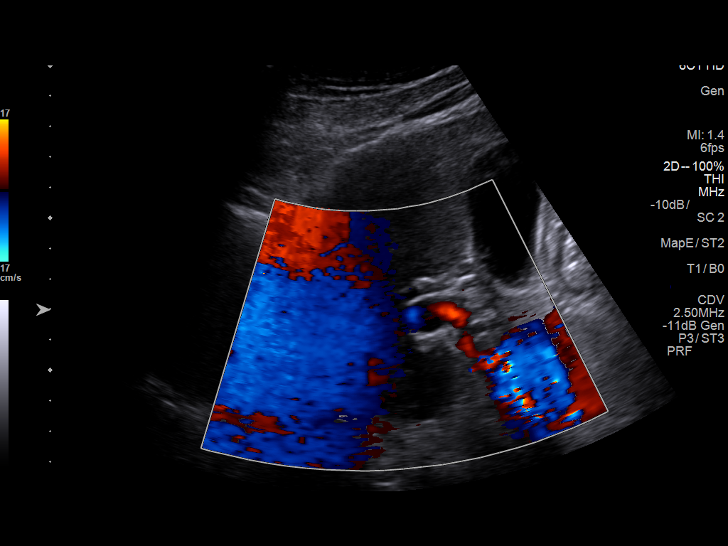
[im 14/37]
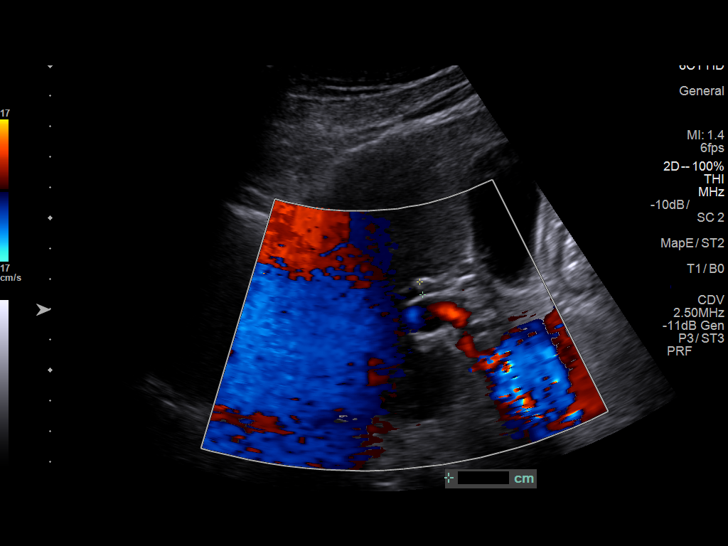
[im 17/37]
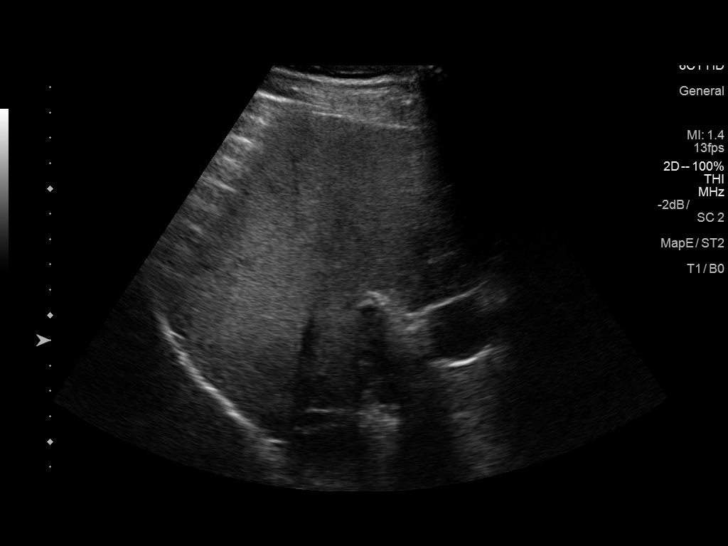
[im 20/37]
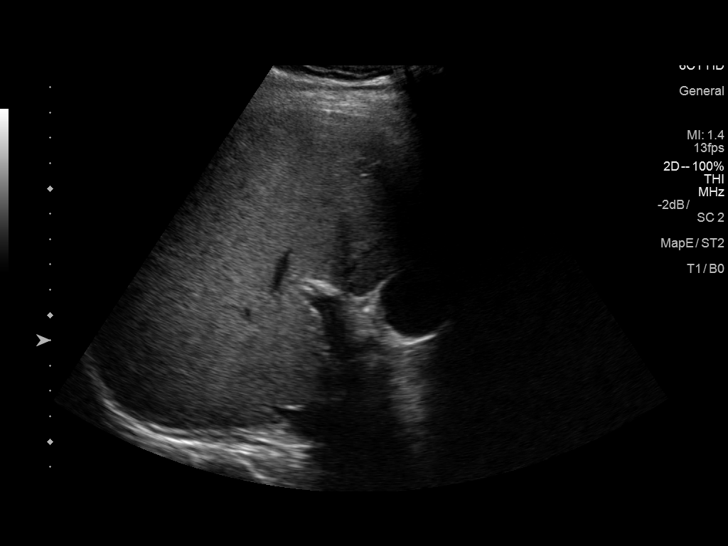
[im 23/37]
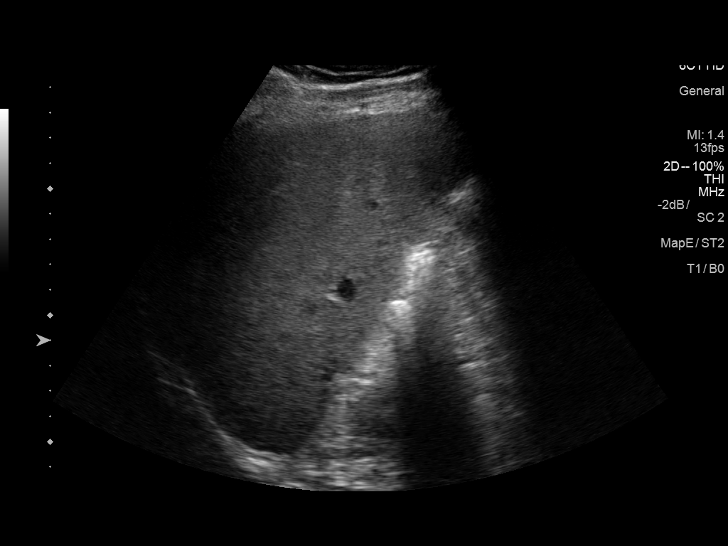
[im 25/37]
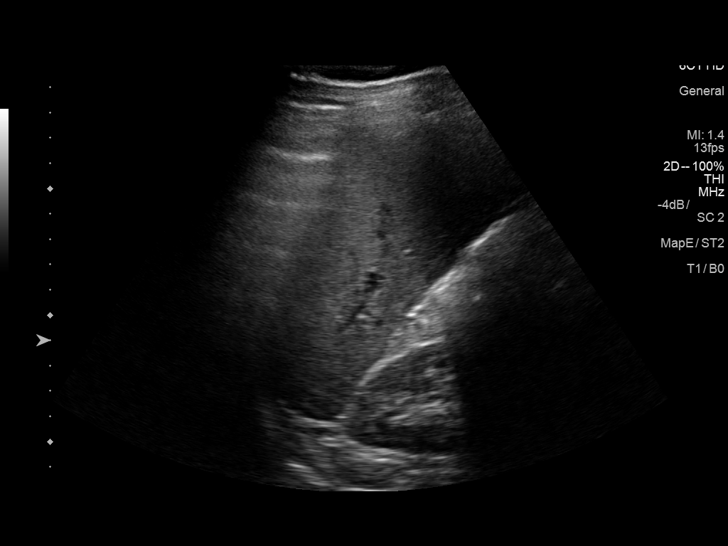
[im 28/37]
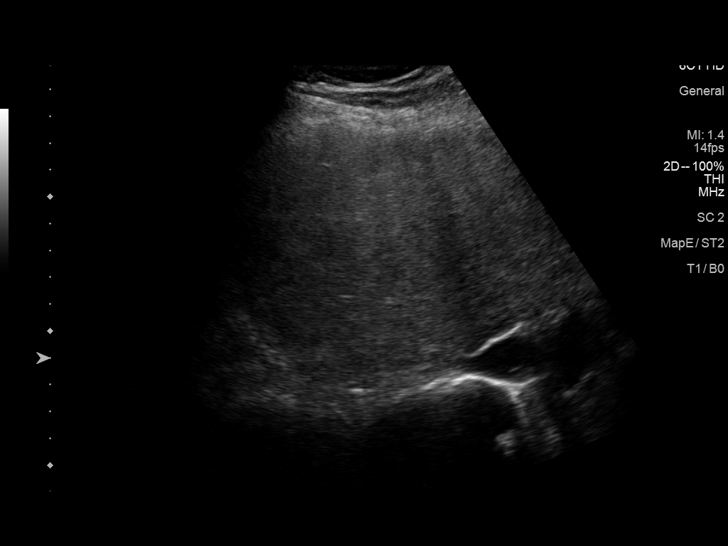
[im 31/37]
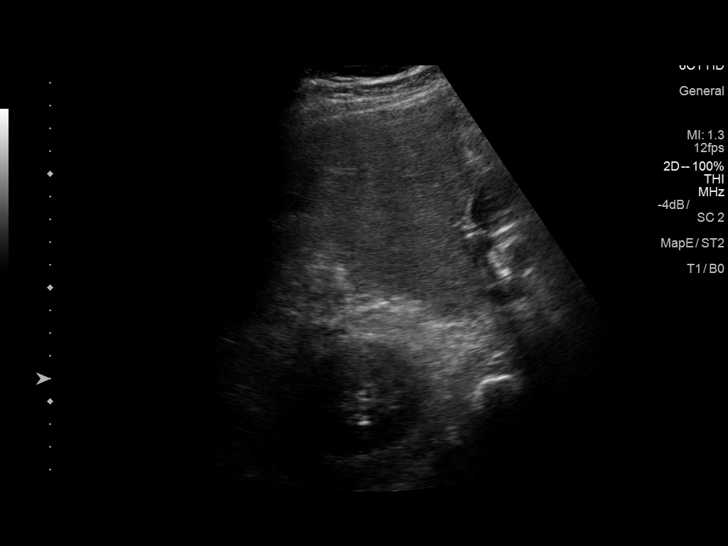
[im 34/37]
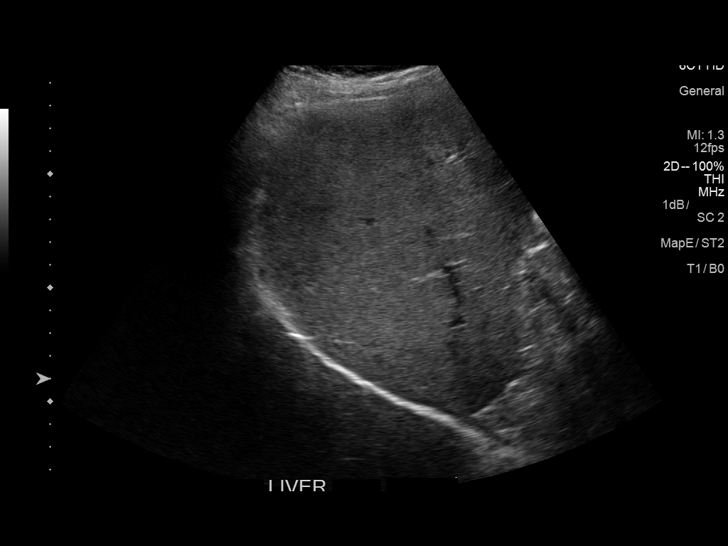
[im 37/37]
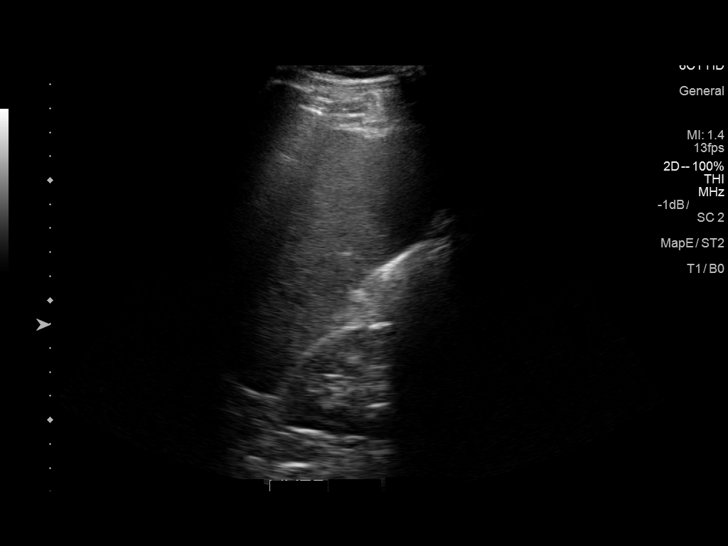

[14 of 25 positions shown; findings below may reference images not displayed]

FINDINGS: Gallbladder:

Gallbladder has a normal appearance. Gallbladder wall is 2.3 mm,
within normal limits. No stones or pericholecystic fluid. No
sonographic Murphy's sign.

Common bile duct:

Diameter: 4.3 mm

Liver:

No focal lesion identified. Within normal limits in parenchymal
echogenicity. Portal vein is patent on color Doppler imaging with
normal direction of blood flow towards the liver.
IMPRESSION: Normal exam.

## 2020-05-20 IMAGING — US US ABDOMEN LIMITED
1 series · 14 of 25 positions shown · non-contrast
Comparison: 08/12/2017 ultrasound.

CLINICAL DATA: 67-year-old male with cirrhosis/hepatitis C.
Subsequent encounter.

EXAM:
ULTRASOUND ABDOMEN LIMITED RIGHT UPPER QUADRANT

[Series 1: us abdomen limited · 0.25mm/px · 14 of 30 slices shown]
[im 1/30]
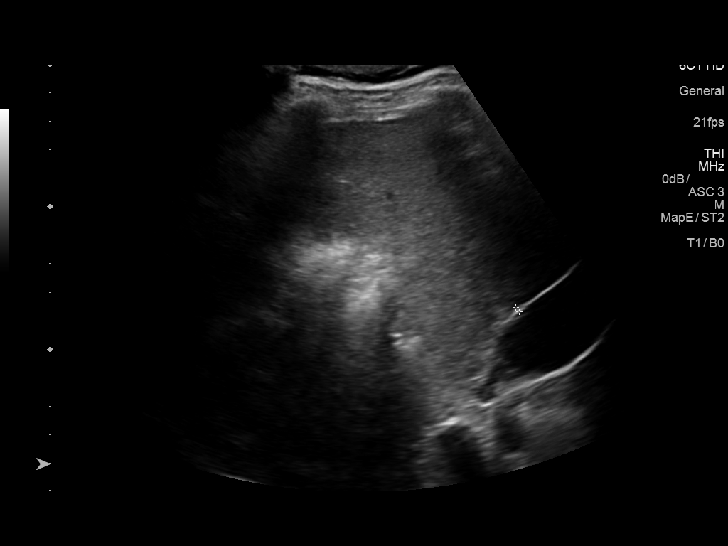
[im 3/30]
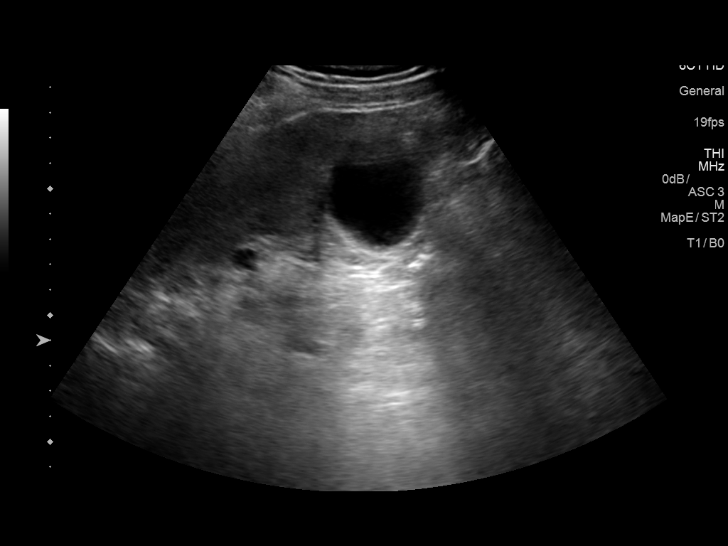
[im 5/30]
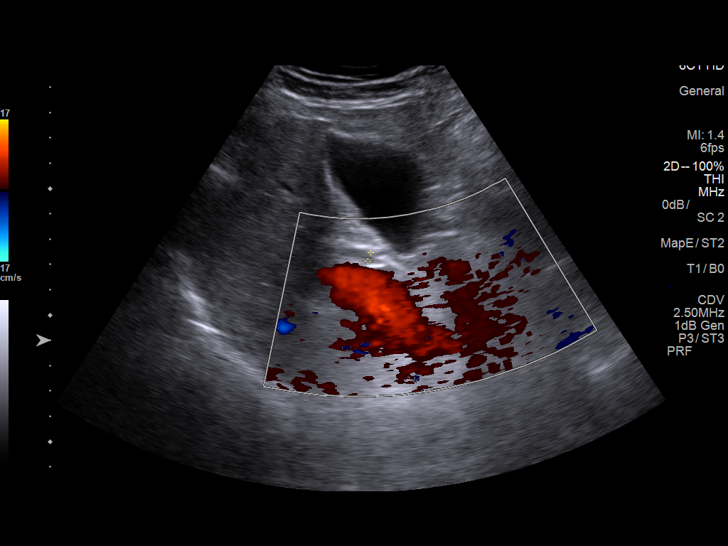
[im 8/30]
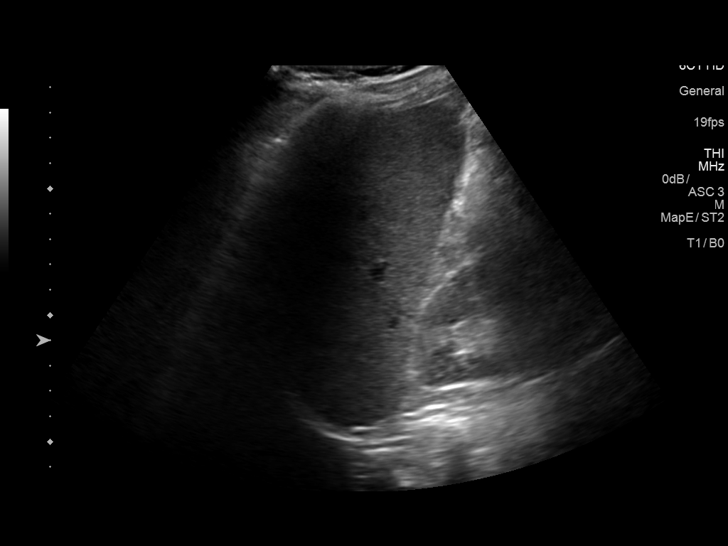
[im 10/30]
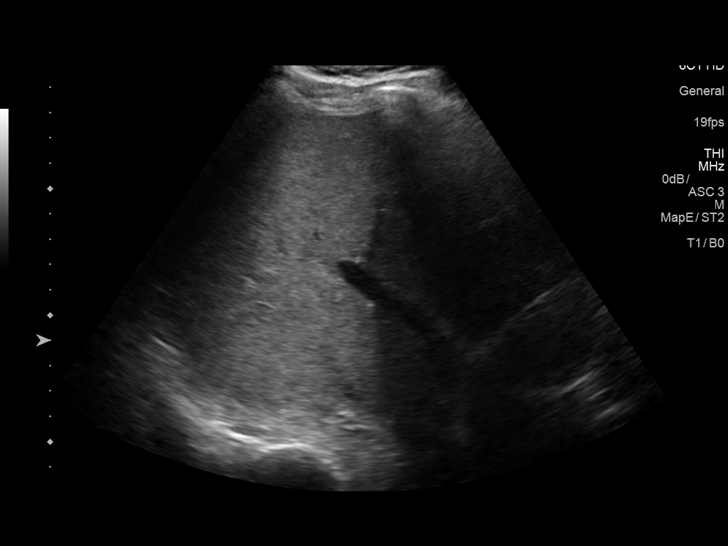
[im 11/30]
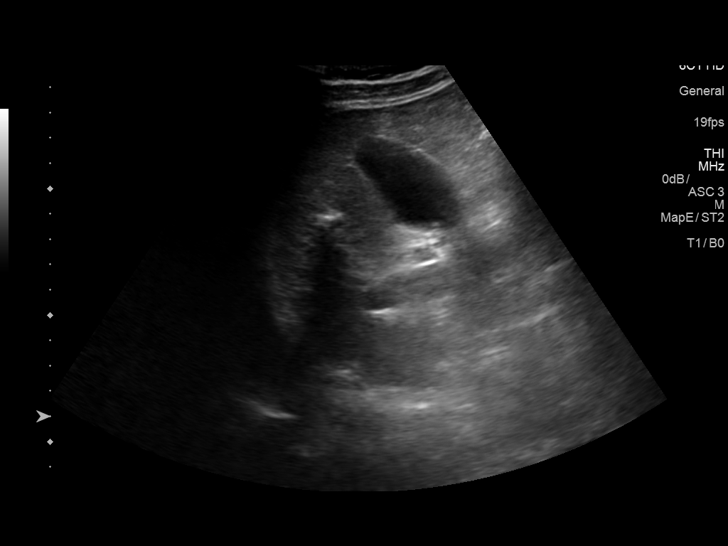
[im 14/30]
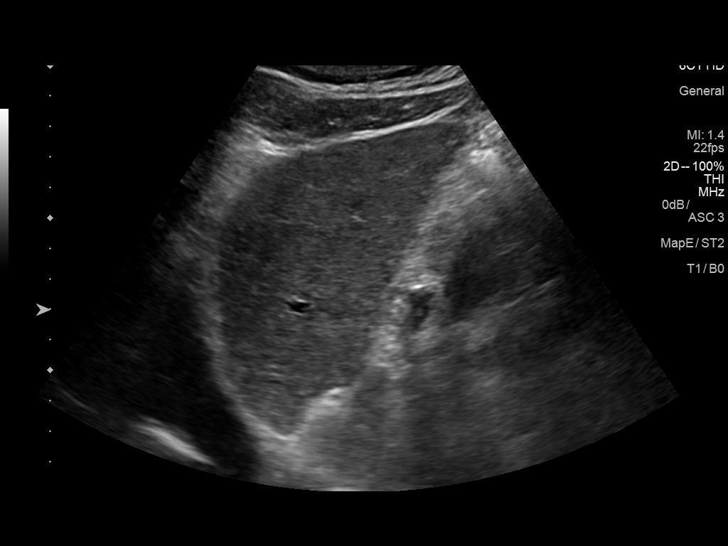
[im 16/30]
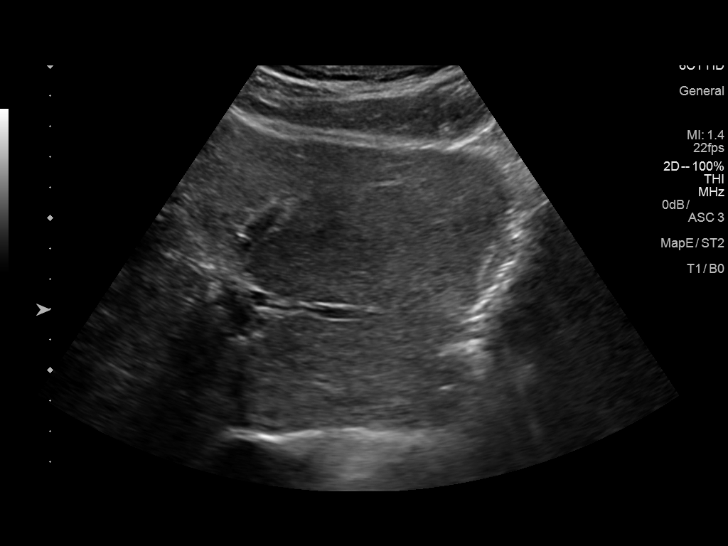
[im 19/30]
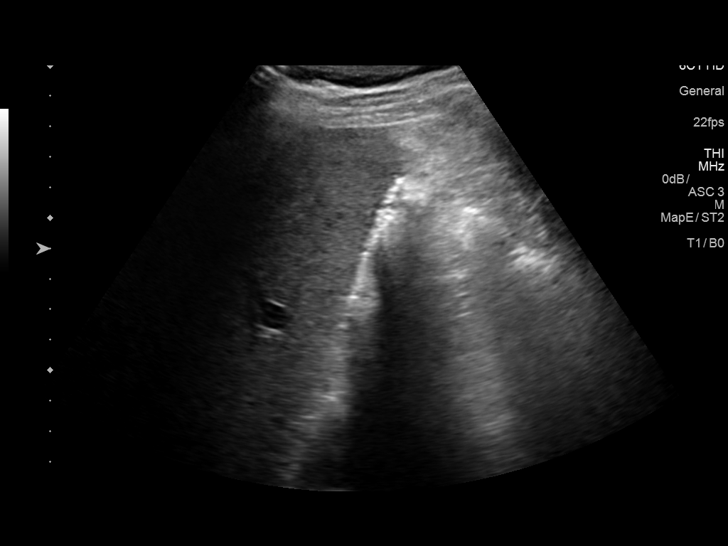
[im 20/30]
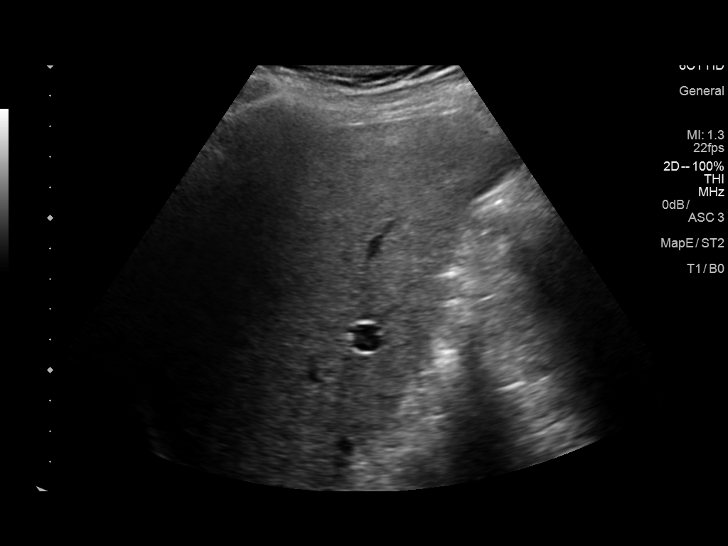
[im 22/30]
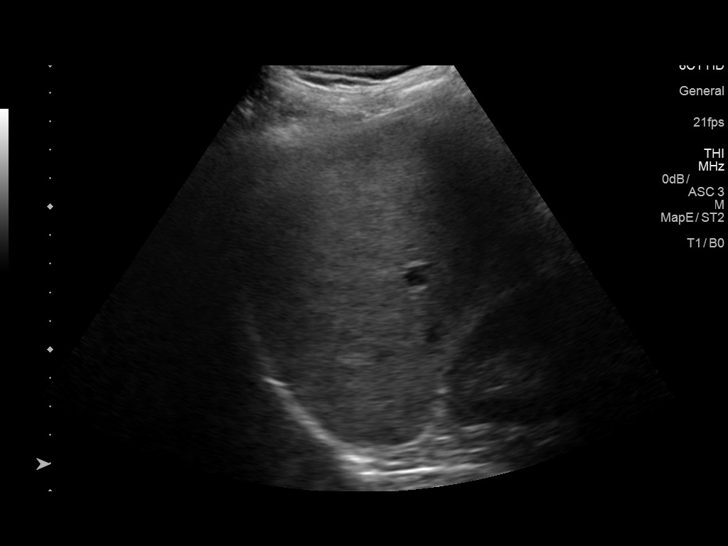
[im 25/30]
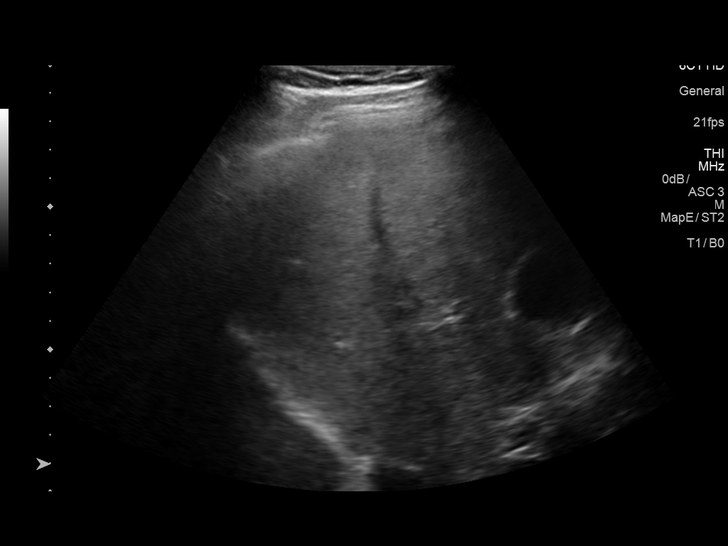
[im 27/30]
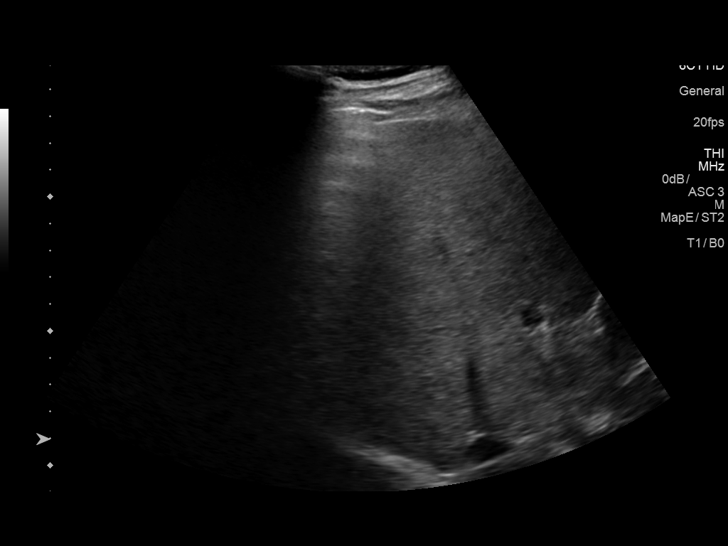
[im 30/30]
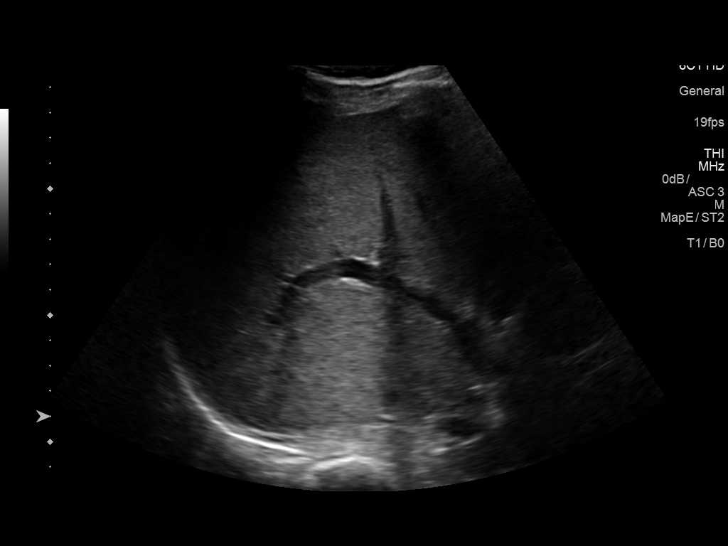

[14 of 25 positions shown; findings below may reference images not displayed]

FINDINGS: Gallbladder:

No gallstones or wall thickening visualized. No sonographic Murphy
sign noted by sonographer.

Common bile duct:

Diameter: 3.6 mm.

Liver:

Coarse increased echotexture of the liver greater left lobe. No
focal hepatic lesion noted. Portal vein is patent on color Doppler
imaging with normal direction of blood flow towards the liver.
IMPRESSION: Coarse increased echotexture of the liver consistent with history of
cirrhosis. No focal hepatic lesion noted.

Remainder of exam unremarkable.

## 2020-09-29 DIAGNOSIS — M25571 Pain in right ankle and joints of right foot: Secondary | ICD-10-CM | POA: Diagnosis not present

## 2020-10-17 DIAGNOSIS — G4733 Obstructive sleep apnea (adult) (pediatric): Secondary | ICD-10-CM | POA: Diagnosis not present

## 2020-10-17 DIAGNOSIS — I10 Essential (primary) hypertension: Secondary | ICD-10-CM | POA: Diagnosis not present

## 2020-10-17 DIAGNOSIS — E785 Hyperlipidemia, unspecified: Secondary | ICD-10-CM | POA: Diagnosis not present

## 2020-10-22 DIAGNOSIS — I10 Essential (primary) hypertension: Secondary | ICD-10-CM | POA: Diagnosis not present

## 2020-10-22 DIAGNOSIS — M62830 Muscle spasm of back: Secondary | ICD-10-CM | POA: Diagnosis not present

## 2020-10-22 DIAGNOSIS — E785 Hyperlipidemia, unspecified: Secondary | ICD-10-CM | POA: Diagnosis not present

## 2020-10-30 DIAGNOSIS — G4733 Obstructive sleep apnea (adult) (pediatric): Secondary | ICD-10-CM | POA: Diagnosis not present

## 2020-10-30 DIAGNOSIS — I10 Essential (primary) hypertension: Secondary | ICD-10-CM | POA: Diagnosis not present

## 2020-10-31 DIAGNOSIS — G4733 Obstructive sleep apnea (adult) (pediatric): Secondary | ICD-10-CM | POA: Diagnosis not present

## 2020-11-27 IMAGING — US US ABDOMEN LIMITED
1 series · 14 of 25 positions shown · non-contrast
Comparison: 02/13/2018

CLINICAL DATA: 67-year-old male with a history of cirrhosis

EXAM:
ULTRASOUND ABDOMEN LIMITED RIGHT UPPER QUADRANT

[Series 1: us abdomen limited · 0.26mm/px · 14 of 29 slices shown]
[im 1/29]
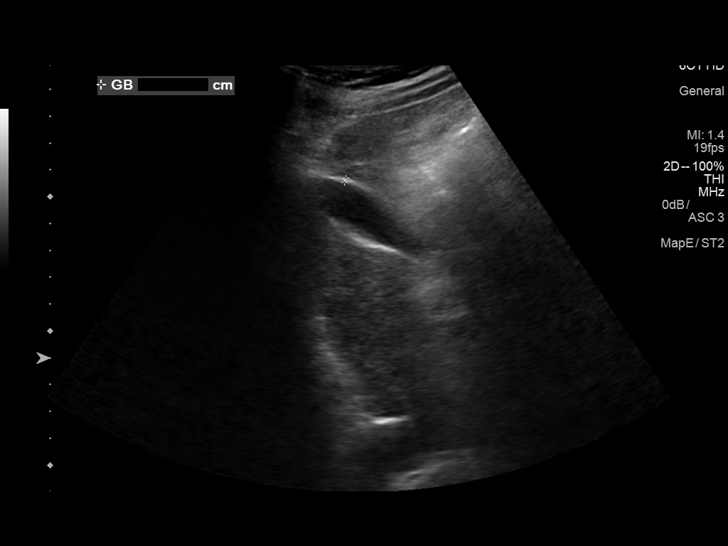
[im 3/29]
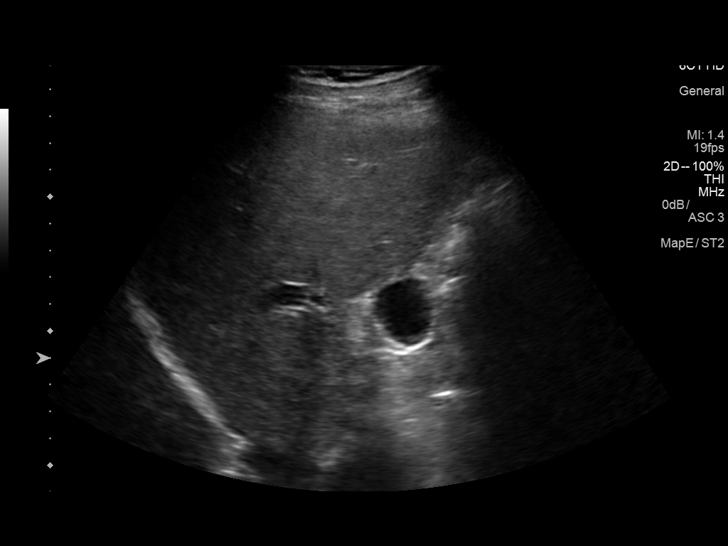
[im 5/29]
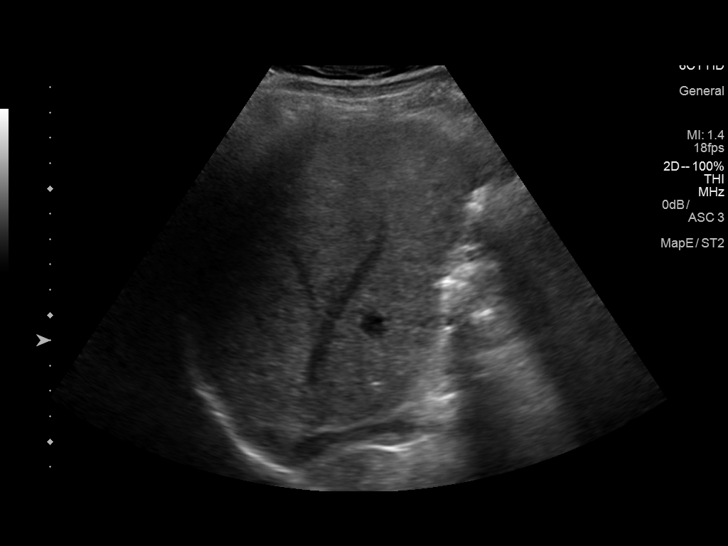
[im 8/29]
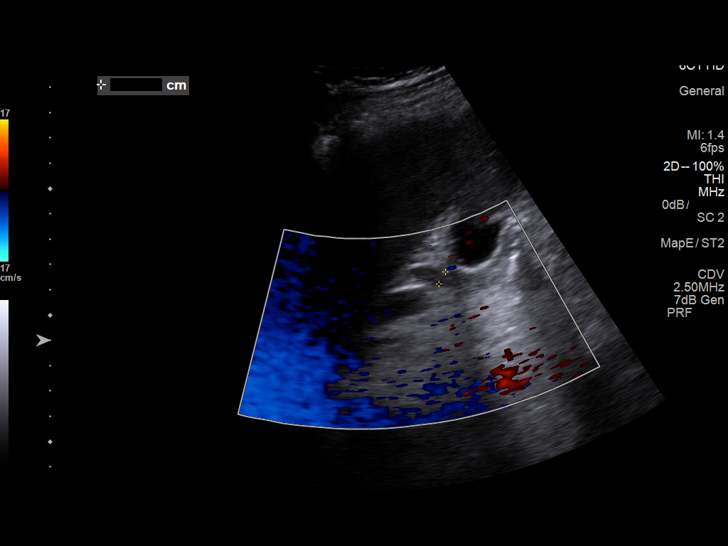
[im 10/29]
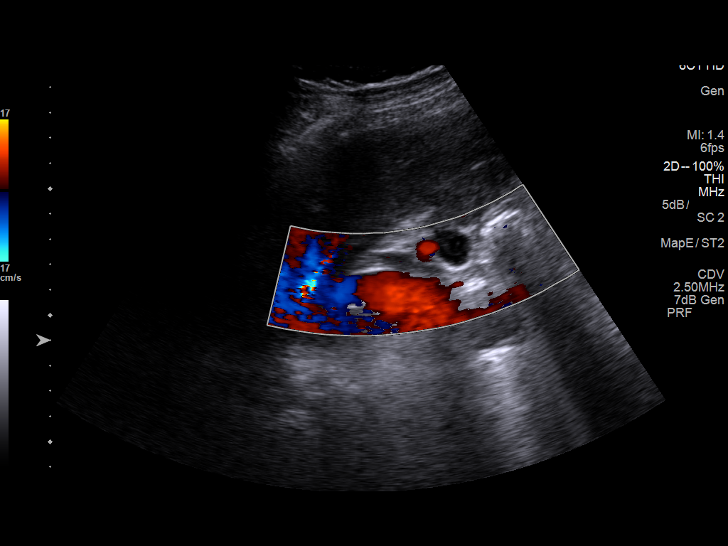
[im 11/29]
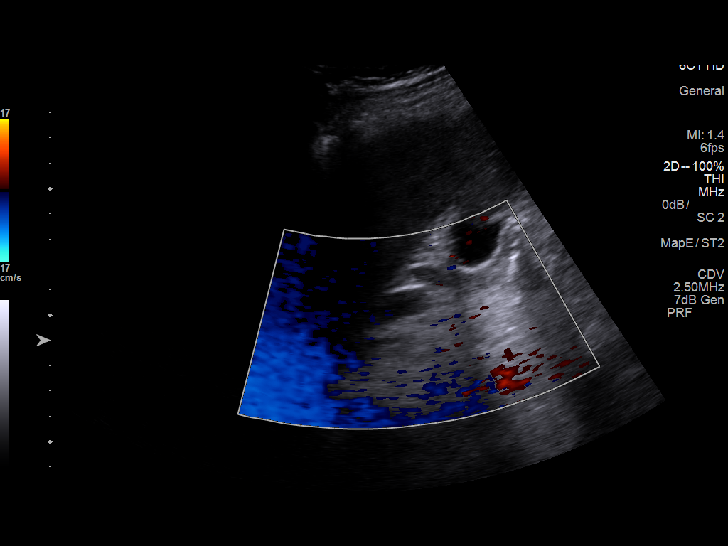
[im 13/29]
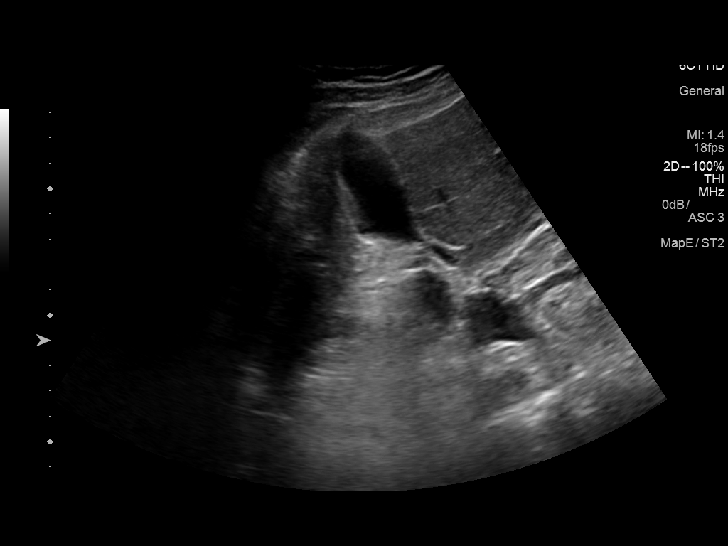
[im 16/29]
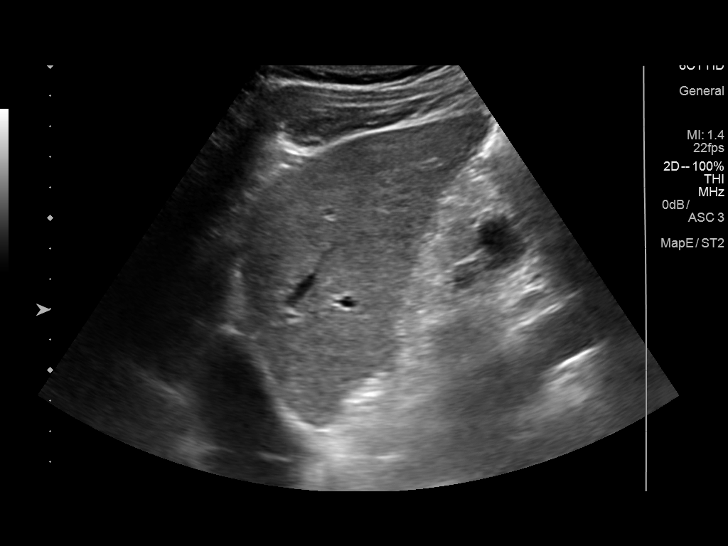
[im 18/29]
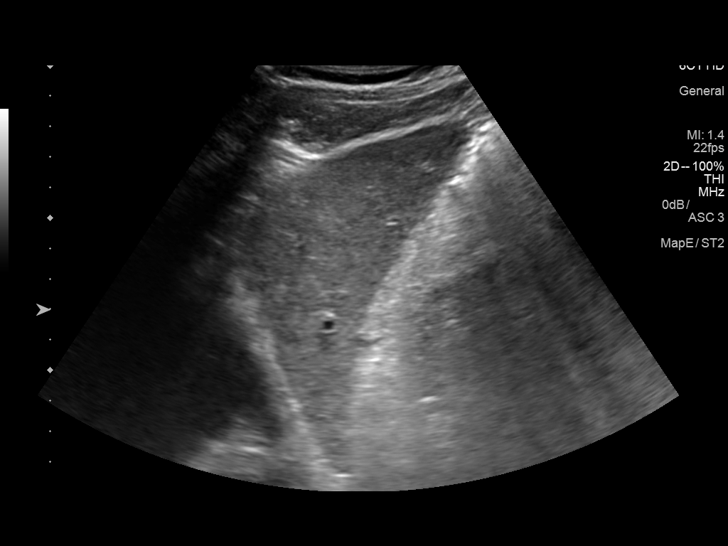
[im 19/29]
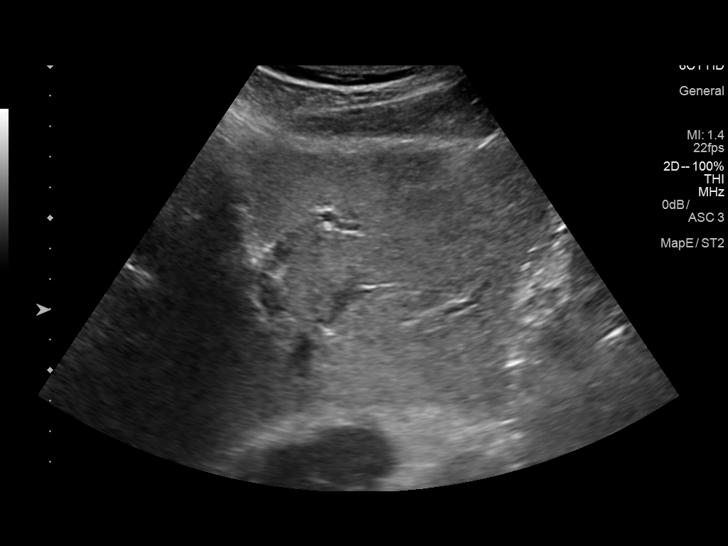
[im 22/29]
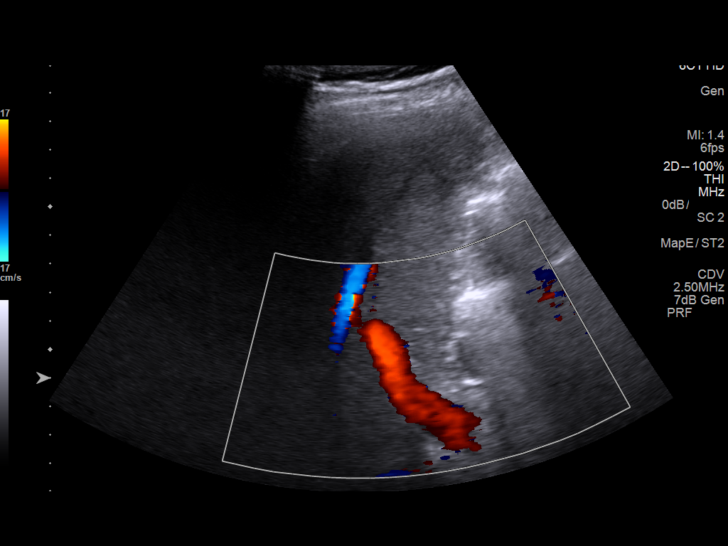
[im 24/29]
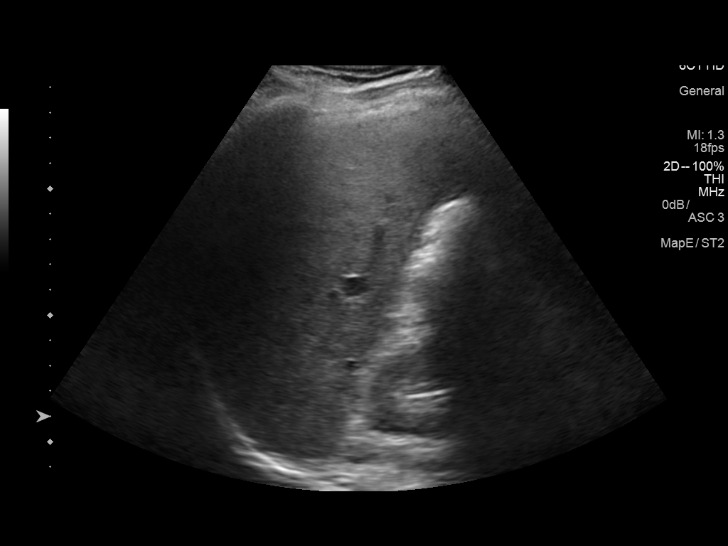
[im 26/29]
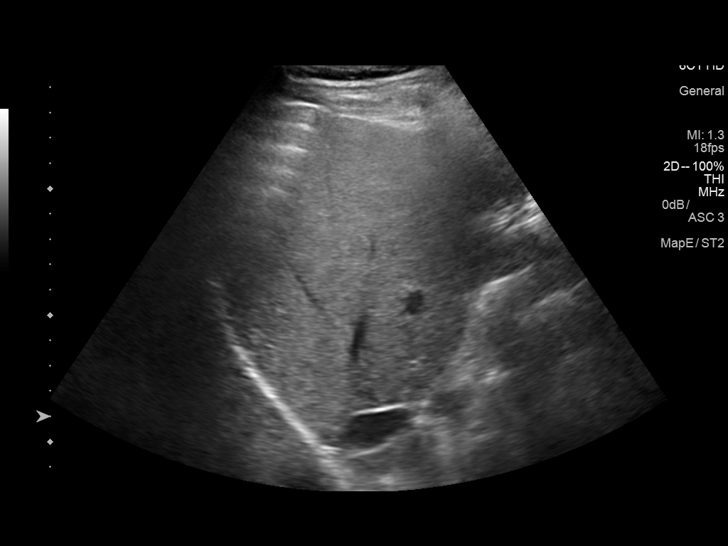
[im 29/29]
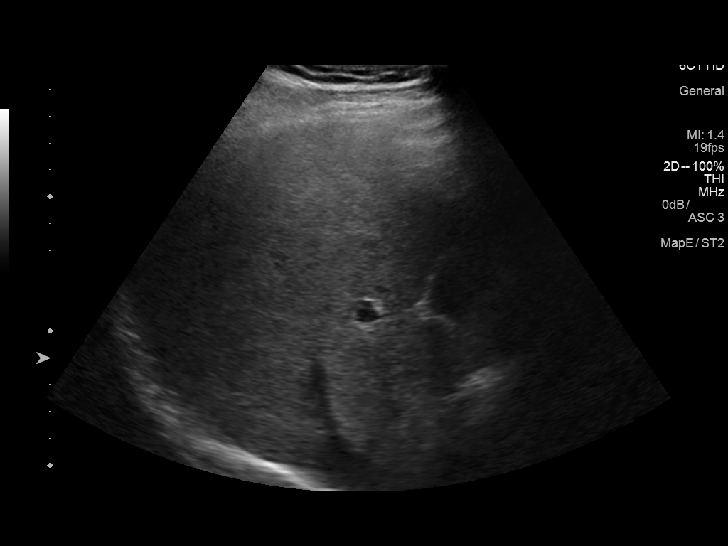

[14 of 25 positions shown; findings below may reference images not displayed]

FINDINGS: Gallbladder:

No gallstones or wall thickening visualized. No sonographic Murphy
sign noted by sonographer.

Common bile duct:

Diameter: 5 mm

Liver:

Coarsened echotexture of liver parenchyma without significant
nodular contour. No focal lesion identified. Portal vein is patent
on color Doppler imaging with normal direction of blood flow towards
the liver.
IMPRESSION: Unremarkable sonographic survey of the gallbladder.

Heterogeneous appearance of liver parenchyma suggesting medical
liver disease, with no specific signs of cirrhosis. If further
evaluation is warranted, elastography may be considered.
